# Patient Record
Sex: Male | Born: 1965 | Hispanic: Yes | Marital: Single | Smoking: Never smoker
Health system: Southern US, Community
[De-identification: ages and names within clinical notes are randomized; demographics above are authoritative.]

## PROBLEM LIST (undated history)

## (undated) DIAGNOSIS — I10 Essential (primary) hypertension: Secondary | ICD-10-CM

## (undated) DIAGNOSIS — K219 Gastro-esophageal reflux disease without esophagitis: Secondary | ICD-10-CM

## (undated) DIAGNOSIS — D649 Anemia, unspecified: Secondary | ICD-10-CM

## (undated) DIAGNOSIS — R51 Headache: Secondary | ICD-10-CM

---

## 2008-11-12 ENCOUNTER — Emergency Department (HOSPITAL_COMMUNITY): Admission: EM | Admit: 2008-11-12 | Discharge: 2008-11-12 | Payer: Self-pay | Admitting: Emergency Medicine

## 2010-09-09 ENCOUNTER — Emergency Department (HOSPITAL_COMMUNITY)
Admission: EM | Admit: 2010-09-09 | Discharge: 2010-09-09 | Payer: Self-pay | Source: Home / Self Care | Admitting: Emergency Medicine

## 2010-11-19 LAB — URINE MICROSCOPIC-ADD ON

## 2010-11-19 LAB — URINALYSIS, ROUTINE W REFLEX MICROSCOPIC
Glucose, UA: NEGATIVE mg/dL
Ketones, ur: 40 mg/dL — AB
Protein, ur: 100 mg/dL — AB

## 2010-11-19 LAB — COMPREHENSIVE METABOLIC PANEL
ALT: 100 U/L — ABNORMAL HIGH (ref 0–53)
Alkaline Phosphatase: 107 U/L (ref 39–117)
CO2: 27 mEq/L (ref 19–32)
Chloride: 74 mEq/L — ABNORMAL LOW (ref 96–112)
GFR calc non Af Amer: >60 mL/min (ref >60)
Glucose, Bld: 126 mg/dL — ABNORMAL HIGH (ref 70–99)
Potassium: 3.1 mEq/L — ABNORMAL LOW (ref 3.5–5.1)
Sodium: 118 mEq/L — CL (ref 135–145)
Total Protein: 6.7 g/dL (ref 6.0–8.3)

## 2010-11-19 LAB — CBC
HCT: 35.3 % — ABNORMAL LOW (ref 39.0–52.0)
Hemoglobin: 12.6 g/dL — ABNORMAL LOW (ref 13.0–17.0)
RBC: 4.19 MIL/uL — ABNORMAL LOW (ref 4.22–5.81)
WBC: 7.4 10*3/uL (ref 4.0–10.5)

## 2010-11-19 LAB — DIFFERENTIAL
Basophils Relative: 0 % (ref 0–1)
Eosinophils Absolute: 0 10*3/uL (ref 0.0–0.7)
Monocytes Relative: 10 % (ref 3–12)
Neutrophils Relative %: 87 % — ABNORMAL HIGH (ref 43–77)

## 2010-12-20 LAB — DIFFERENTIAL
Eosinophils Relative: 1 % (ref 0–5)
Lymphocytes Relative: 27 % (ref 12–46)
Lymphs Abs: 1.7 10*3/uL (ref 0.7–4.0)
Monocytes Absolute: 0.5 10*3/uL (ref 0.1–1.0)

## 2010-12-20 LAB — URINE CULTURE: Culture: NO GROWTH

## 2010-12-20 LAB — URINALYSIS, ROUTINE W REFLEX MICROSCOPIC
Glucose, UA: NEGATIVE mg/dL
Ketones, ur: NEGATIVE mg/dL
Protein, ur: NEGATIVE mg/dL

## 2010-12-20 LAB — BASIC METABOLIC PANEL
GFR calc Af Amer: >60 mL/min (ref >60)
GFR calc non Af Amer: >60 mL/min (ref >60)
Potassium: 3.6 mEq/L (ref 3.5–5.1)
Sodium: 137 mEq/L (ref 135–145)

## 2010-12-20 LAB — TYPE AND SCREEN
ABO/RH(D): A POS
Antibody Screen: NEGATIVE

## 2010-12-20 LAB — CBC
HCT: 40.2 % (ref 39.0–52.0)
Hemoglobin: 14.1 g/dL (ref 13.0–17.0)
RBC: 4.55 MIL/uL (ref 4.22–5.81)
WBC: 6.2 10*3/uL (ref 4.0–10.5)

## 2010-12-20 LAB — APTT: aPTT: 29 seconds (ref 24–37)

## 2010-12-20 LAB — ABO/RH: ABO/RH(D): A POS

## 2013-01-15 ENCOUNTER — Ambulatory Visit (INDEPENDENT_AMBULATORY_CARE_PROVIDER_SITE_OTHER): Payer: Self-pay | Admitting: General Surgery

## 2013-01-15 ENCOUNTER — Encounter (INDEPENDENT_AMBULATORY_CARE_PROVIDER_SITE_OTHER): Payer: Self-pay | Admitting: General Surgery

## 2013-01-15 VITALS — BP 134/82 | HR 64 | Temp 97.3°F | Resp 14 | Ht 65.0 in | Wt 155.8 lb

## 2013-01-15 DIAGNOSIS — K4020 Bilateral inguinal hernia, without obstruction or gangrene, not specified as recurrent: Secondary | ICD-10-CM

## 2013-01-15 NOTE — Progress Notes (Signed)
Subjective:     Patient ID: Randy Barker, male   DOB: 08/08/1966, 47 y.o.   MRN: [id number]  HPI Patient is a Spanish-speaking, 47-year-old male was self-referred for evaluation of a right inguinal hernia. The patient states she's had these for approximately 5 years it is becoming more bothersome as He works. The patient works in construction.  The patient states he is able to reduce the hernia however becomes more and symptomatic as his lifting heavy objects.  Review of Systems  Constitutional: Negative.   HENT: Negative.   Respiratory: Negative.   Cardiovascular: Negative.   Gastrointestinal: Negative.   Neurological: Negative.   All other systems reviewed and are negative.       Objective:   Physical Exam  Constitutional: He is oriented to person, place, and time. He appears well-developed and well-nourished.  HENT:  Head: Normocephalic and atraumatic.  Eyes: Conjunctivae and EOM are normal. Pupils are equal, round, and reactive to light.  Neck: Normal range of motion. Neck supple.  Cardiovascular: Normal rate, regular rhythm and normal heart sounds.   Pulmonary/Chest: Effort normal and breath sounds normal.  Abdominal: Bowel sounds are normal. A hernia is present. Hernia confirmed positive in the right inguinal area and confirmed positive in the left inguinal area.  Genitourinary: Penis normal.  Neurological: He is alert and oriented to person, place, and time.       Assessment:     47-year-old male with bilateral inguinal hernia.     Plan:     1. Patient like to proceed with operative repair. He would be determined for laparoscopic bilateral inguinal hernia repair with mesh. 2. All risks and benefits were discussed with the patient, to generally include infection, bleeding, damage to surrounding structures, acute and chronic nerve pain, and recurrence. Alternatives were offered and described.  All questions were answered and the patient voiced understanding of the  procedure and wishes to proceed at this point.        

## 2013-01-15 NOTE — Addendum Note (Signed)
Addended by: Axel Filler on: 01/15/2013 03:16 PM   Modules accepted: Orders

## 2013-01-21 ENCOUNTER — Encounter (HOSPITAL_COMMUNITY)
Admission: RE | Admit: 2013-01-21 | Discharge: 2013-01-21 | Disposition: A | Discharge status: Home / Self Care | Payer: Self-pay | Source: Ambulatory Visit | Attending: General Surgery | Admitting: General Surgery

## 2013-01-21 ENCOUNTER — Encounter (HOSPITAL_COMMUNITY): Payer: Self-pay

## 2013-01-21 HISTORY — DX: Gastro-esophageal reflux disease without esophagitis: K21.9

## 2013-01-21 HISTORY — DX: Headache: R51

## 2013-01-21 LAB — CBC
MCHC: 33.9 g/dL (ref 30.0–36.0)
MCV: 87.6 fL (ref 78.0–100.0)
Platelets: 218 10*3/uL (ref 150–400)
RDW: 13.4 % (ref 11.5–15.5)
WBC: 7.7 10*3/uL (ref 4.0–10.5)

## 2013-01-21 LAB — SURGICAL PCR SCREEN: Staphylococcus aureus: POSITIVE — AB

## 2013-01-21 NOTE — Progress Notes (Signed)
Interpreter arranged for surgery per Jamesetta Orleans

## 2013-01-21 NOTE — Pre-Procedure Instructions (Addendum)
Randy Barker  01/21/2013   Your procedure is scheduled on 01/25/13  Report to [facility] at 1200pm.  Call this number if you have problems the morning of surgery: [phone]   Remember:   Do not eat food or drink liquids after midnight.   Take these medicines the morning of surgery with A SIP OF WATER: none   Do not wear jewelry, make-up or nail polish.  Do not wear lotions, powders, or perfumes. You may wear deodorant.  Do not shave 48 hours prior to surgery. Men may shave face and neck.  Do not bring valuables to the hospital.  Contacts, dentures or bridgework may not be worn into surgery.  Leave suitcase in the car. After surgery it may be brought to your room.  For patients admitted to the hospital, checkout time is 11:00 AM the day of  discharge.   Patients discharged the day of surgery will not be allowed to drive  home.  Name and phone number of your driver:   Special Instructions: Shower using CHG 2 nights before surgery and the night before surgery.  If you shower the day of surgery use CHG.  Use special wash - you have one bottle of CHG for all showers.  You should use approximately 1/3 of the bottle for each shower.   Please read over the following fact sheets that you were given: Pain Booklet, Coughing and Deep Breathing, MRSA Information and Surgical Site Infection Prevention

## 2013-01-24 MED ORDER — CEFAZOLIN SODIUM-DEXTROSE 2-3 GM-% IV SOLR
2.0000 g | INTRAVENOUS | Status: AC
  Administered 2013-01-25: 2 g via INTRAVENOUS
  Filled 2013-01-24: qty 50

## 2013-01-25 ENCOUNTER — Ambulatory Visit (HOSPITAL_COMMUNITY)
Admission: RE | Admit: 2013-01-25 | Discharge: 2013-01-25 | Disposition: A | Discharge status: Home / Self Care | Payer: Self-pay | Source: Ambulatory Visit | Attending: General Surgery | Admitting: General Surgery

## 2013-01-25 ENCOUNTER — Encounter (HOSPITAL_COMMUNITY): Payer: Self-pay | Admitting: Anesthesiology

## 2013-01-25 ENCOUNTER — Ambulatory Visit (HOSPITAL_COMMUNITY): Payer: Self-pay | Admitting: Anesthesiology

## 2013-01-25 ENCOUNTER — Encounter (HOSPITAL_COMMUNITY): Payer: Self-pay | Admitting: *Deleted

## 2013-01-25 ENCOUNTER — Encounter (HOSPITAL_COMMUNITY)
Admission: RE | Disposition: A | Discharge status: Home / Self Care | Payer: Self-pay | Source: Ambulatory Visit | Attending: General Surgery

## 2013-01-25 DIAGNOSIS — K4020 Bilateral inguinal hernia, without obstruction or gangrene, not specified as recurrent: Secondary | ICD-10-CM

## 2013-01-25 DIAGNOSIS — Z01812 Encounter for preprocedural laboratory examination: Secondary | ICD-10-CM | POA: Insufficient documentation

## 2013-01-25 HISTORY — PX: INSERTION OF MESH: SHX5868

## 2013-01-25 HISTORY — PX: INGUINAL HERNIA REPAIR: SHX194

## 2013-01-25 SURGERY — REPAIR, HERNIA, INGUINAL, BILATERAL, LAPAROSCOPIC
Anesthesia: General | Site: Groin | Laterality: Bilateral | Wound class: Clean

## 2013-01-25 MED ORDER — PROPOFOL 10 MG/ML IV BOLUS
INTRAVENOUS | Status: DC | PRN
  Administered 2013-01-25: 150 mg via INTRAVENOUS

## 2013-01-25 MED ORDER — EPHEDRINE SULFATE 50 MG/ML IJ SOLN
INTRAMUSCULAR | Status: DC | PRN
  Administered 2013-01-25 (×2): 10 mg via INTRAVENOUS

## 2013-01-25 MED ORDER — LIDOCAINE HCL (CARDIAC) 20 MG/ML IV SOLN
INTRAVENOUS | Status: DC | PRN
  Administered 2013-01-25: 100 mg via INTRAVENOUS

## 2013-01-25 MED ORDER — OXYCODONE-ACETAMINOPHEN 5-325 MG PO TABS: 1.0000 | ORAL_TABLET | ORAL | Status: DC | PRN

## 2013-01-25 MED ORDER — HYDROMORPHONE HCL PF 1 MG/ML IJ SOLN
INTRAMUSCULAR | Status: AC
  Filled 2013-01-25: qty 1

## 2013-01-25 MED ORDER — LACTATED RINGERS IV SOLN
INTRAVENOUS | Status: DC
  Administered 2013-01-25: 15:00:00 via INTRAVENOUS
  Administered 2013-01-25: 50 mL/h via INTRAVENOUS

## 2013-01-25 MED ORDER — FENTANYL CITRATE 0.05 MG/ML IJ SOLN
INTRAMUSCULAR | Status: DC | PRN
  Administered 2013-01-25: 100 ug via INTRAVENOUS
  Administered 2013-01-25 (×3): 50 ug via INTRAVENOUS

## 2013-01-25 MED ORDER — NEOSTIGMINE METHYLSULFATE 1 MG/ML IJ SOLN
INTRAMUSCULAR | Status: DC | PRN
  Administered 2013-01-25: 4 mg via INTRAVENOUS

## 2013-01-25 MED ORDER — ACETAMINOPHEN 10 MG/ML IV SOLN
INTRAVENOUS | Status: DC | PRN
  Administered 2013-01-25: 1000 mg via INTRAVENOUS

## 2013-01-25 MED ORDER — ROCURONIUM BROMIDE 100 MG/10ML IV SOLN
INTRAVENOUS | Status: DC | PRN
  Administered 2013-01-25: 10 mg via INTRAVENOUS
  Administered 2013-01-25: 50 mg via INTRAVENOUS

## 2013-01-25 MED ORDER — GLYCOPYRROLATE 0.2 MG/ML IJ SOLN
INTRAMUSCULAR | Status: DC | PRN
  Administered 2013-01-25: 0.6 mg via INTRAVENOUS

## 2013-01-25 MED ORDER — MEPERIDINE HCL 25 MG/ML IJ SOLN: 6.2500 mg | INTRAMUSCULAR | Status: DC | PRN

## 2013-01-25 MED ORDER — CHLORHEXIDINE GLUCONATE 4 % EX LIQD: 1.0000 "application " | Freq: Once | CUTANEOUS | Status: DC

## 2013-01-25 MED ORDER — OXYCODONE HCL 5 MG/5ML PO SOLN: 5.0000 mg | Freq: Once | ORAL | Status: DC | PRN

## 2013-01-25 MED ORDER — PROMETHAZINE HCL 25 MG/ML IJ SOLN
6.2500 mg | INTRAMUSCULAR | Status: DC | PRN
  Administered 2013-01-25: 12.5 mg via INTRAVENOUS
  Filled 2013-01-25: qty 1

## 2013-01-25 MED ORDER — ONDANSETRON HCL 4 MG/2ML IJ SOLN
INTRAMUSCULAR | Status: DC | PRN
  Administered 2013-01-25: 4 mg via INTRAVENOUS

## 2013-01-25 MED ORDER — 0.9 % SODIUM CHLORIDE (POUR BTL) OPTIME
TOPICAL | Status: DC | PRN
  Administered 2013-01-25: 1000 mL

## 2013-01-25 MED ORDER — HYDROMORPHONE HCL PF 1 MG/ML IJ SOLN
0.2500 mg | INTRAMUSCULAR | Status: DC | PRN
  Administered 2013-01-25 (×2): 0.5 mg via INTRAVENOUS

## 2013-01-25 MED ORDER — OXYCODONE HCL 5 MG PO TABS: 5.0000 mg | ORAL_TABLET | Freq: Once | ORAL | Status: DC | PRN

## 2013-01-25 MED ORDER — CEFAZOLIN SODIUM-DEXTROSE 2-3 GM-% IV SOLR
INTRAVENOUS | Status: AC
  Filled 2013-01-25: qty 50

## 2013-01-25 MED ORDER — BUPIVACAINE HCL (PF) 0.25 % IJ SOLN
INTRAMUSCULAR | Status: AC
  Filled 2013-01-25: qty 30

## 2013-01-25 MED ORDER — BUPIVACAINE HCL (PF) 0.25 % IJ SOLN
INTRAMUSCULAR | Status: DC | PRN
  Administered 2013-01-25: 5 mL

## 2013-01-25 MED ORDER — MIDAZOLAM HCL 5 MG/5ML IJ SOLN
INTRAMUSCULAR | Status: DC | PRN
  Administered 2013-01-25: 2 mg via INTRAVENOUS

## 2013-01-25 SURGICAL SUPPLY — 52 items
APPLIER CLIP LOGIC TI 5 (MISCELLANEOUS) IMPLANT
BENZOIN TINCTURE PRP APPL 2/3 (GAUZE/BANDAGES/DRESSINGS) ×2 IMPLANT
CANISTER SUCTION 2500CC (MISCELLANEOUS) IMPLANT
CHLORAPREP W/TINT 26ML (MISCELLANEOUS) ×4 IMPLANT
CLOTH BEACON ORANGE TIMEOUT ST (SAFETY) ×2 IMPLANT
COVER SURGICAL LIGHT HANDLE (MISCELLANEOUS) ×2 IMPLANT
DEVICE TROCAR PUNCTURE CLOSURE (ENDOMECHANICALS) ×2 IMPLANT
DISSECT BALLN SPACEMKR + OVL (BALLOONS) ×2
DISSECTOR BALLN SPACEMKR + OVL (BALLOONS) ×1 IMPLANT
DISSECTOR BLUNT TIP ENDO 5MM (MISCELLANEOUS) IMPLANT
DRAPE UTILITY 15X26 W/TAPE STR (DRAPE) ×4 IMPLANT
ELECT REM PT RETURN 9FT ADLT (ELECTROSURGICAL) ×2
ELECTRODE REM PT RTRN 9FT ADLT (ELECTROSURGICAL) ×1 IMPLANT
GAUZE SPONGE 2X2 8PLY STRL LF (GAUZE/BANDAGES/DRESSINGS) ×1 IMPLANT
GLOVE BIO SURGEON STRL SZ7.5 (GLOVE) ×2 IMPLANT
GLOVE BIOGEL PI IND STRL 6 (GLOVE) ×1 IMPLANT
GLOVE BIOGEL PI IND STRL 6.5 (GLOVE) IMPLANT
GLOVE BIOGEL PI IND STRL 7.0 (GLOVE) ×1 IMPLANT
GLOVE BIOGEL PI IND STRL 7.5 (GLOVE) ×1 IMPLANT
GLOVE BIOGEL PI INDICATOR 6 (GLOVE) ×1
GLOVE BIOGEL PI INDICATOR 6.5 (GLOVE)
GLOVE BIOGEL PI INDICATOR 7.0 (GLOVE) ×1
GLOVE BIOGEL PI INDICATOR 7.5 (GLOVE) ×1
GLOVE ECLIPSE 7.5 STRL STRAW (GLOVE) ×2 IMPLANT
GLOVE ECLIPSE 8.0 STRL XLNG CF (GLOVE) ×2 IMPLANT
GLOVE SURG SS PI 7.0 STRL IVOR (GLOVE) ×2 IMPLANT
GOWN STRL NON-REIN LRG LVL3 (GOWN DISPOSABLE) ×4 IMPLANT
GOWN STRL REIN XL XLG (GOWN DISPOSABLE) ×2 IMPLANT
KIT BASIN OR (CUSTOM PROCEDURE TRAY) ×2 IMPLANT
KIT ROOM TURNOVER OR (KITS) ×2 IMPLANT
MESH PARIETEX 20X20CM (Mesh General) ×2 IMPLANT
NEEDLE INSUFFLATION 14GA 120MM (NEEDLE) ×2 IMPLANT
NS IRRIG 1000ML POUR BTL (IV SOLUTION) ×2 IMPLANT
PAD ARMBOARD 7.5X6 YLW CONV (MISCELLANEOUS) ×4 IMPLANT
RELOAD STAPLE HERNIA 4.0 BLUE (INSTRUMENTS) ×6 IMPLANT
RELOAD STAPLE HERNIA 4.8 BLK (STAPLE) IMPLANT
SCISSORS LAP 5X35 DISP (ENDOMECHANICALS) ×2 IMPLANT
SET IRRIG TUBING LAPAROSCOPIC (IRRIGATION / IRRIGATOR) IMPLANT
SET TROCAR LAP APPLE-HUNT 5MM (ENDOMECHANICALS) ×2 IMPLANT
SLEEVE ENDOPATH XCEL 5M (ENDOMECHANICALS) ×2 IMPLANT
SPONGE GAUZE 2X2 STER 10/PKG (GAUZE/BANDAGES/DRESSINGS) ×1
STAPLER HERNIA 12 8.5 360D (INSTRUMENTS) ×2 IMPLANT
SUT MNCRL AB 4-0 PS2 18 (SUTURE) ×4 IMPLANT
SUT VIC AB 1 CT1 27 (SUTURE) ×1
SUT VIC AB 1 CT1 27XBRD ANBCTR (SUTURE) ×1 IMPLANT
TOWEL OR 17X24 6PK STRL BLUE (TOWEL DISPOSABLE) ×2 IMPLANT
TOWEL OR 17X26 10 PK STRL BLUE (TOWEL DISPOSABLE) ×2 IMPLANT
TRAY FOLEY CATH 14FR (SET/KITS/TRAYS/PACK) ×2 IMPLANT
TRAY LAPAROSCOPIC (CUSTOM PROCEDURE TRAY) ×2 IMPLANT
TROCAR XCEL 12X100 BLDLESS (ENDOMECHANICALS) IMPLANT
TROCAR XCEL NON-BLD 11X100MML (ENDOMECHANICALS) IMPLANT
TROCAR XCEL NON-BLD 5MMX100MML (ENDOMECHANICALS) ×2 IMPLANT

## 2013-01-25 NOTE — Anesthesia Procedure Notes (Signed)
Procedure Name: Intubation Date/Time: 01/25/2013 1:25 PM Performed by: Sherie Don Pre-anesthesia Checklist: Patient identified, Emergency Drugs available, Suction available, Patient being monitored and Timeout performed Patient Re-evaluated:Patient Re-evaluated prior to inductionOxygen Delivery Method: Circle system utilized Preoxygenation: Pre-oxygenation with 100% oxygen Intubation Type: IV induction Ventilation: Mask ventilation without difficulty Laryngoscope Size: Mac and 4 Grade View: Grade II Tube type: Oral Tube size: 7.5 mm Airway Equipment and Method: Stylet Placement Confirmation: ETT inserted through vocal cords under direct vision and breath sounds checked- equal and bilateral Secured at: 23 cm Dental Injury: Teeth and Oropharynx as per pre-operative assessment

## 2013-01-25 NOTE — Transfer of Care (Signed)
Immediate Anesthesia Transfer of Care Note  Patient: Randy Barker  Procedure(s) Performed: Procedure(s): LAPAROSCOPIC BILATERAL INGUINAL HERNIA REPAIR (Bilateral) INSERTION OF MESH (Bilateral)  Patient Location: PACU  Anesthesia Type:General  Level of Consciousness: awake and alert   Airway & Oxygen Therapy: Patient Spontanous Breathing  Post-op Assessment: Report given to PACU RN and Post -op Vital signs reviewed and stable  Post vital signs: Reviewed and stable  Complications: No apparent anesthesia complications

## 2013-01-25 NOTE — Preoperative (Signed)
Beta Blockers   Reason not to administer Beta Blockers:Not Applicable 

## 2013-01-25 NOTE — Interval H&P Note (Signed)
History and Physical Interval Note:  01/25/2013 12:58 PM  Randy Barker  has presented today for surgery, with the diagnosis of HERNIA  The various methods of treatment have been discussed with the patient and family. After consideration of risks, benefits and other options for treatment, the patient has consented to  Procedure(s): LAPAROSCOPIC BILATERAL INGUINAL HERNIA REPAIR (Bilateral) INSERTION OF MESH (Bilateral) as a surgical intervention .  The patient's history has been reviewed, patient examined, no change in status, stable for surgery.  I have reviewed the patient's chart and labs.  Questions were answered to the patient's satisfaction.     Marigene Ehlers., Jed Limerick

## 2013-01-25 NOTE — Op Note (Signed)
Pre Operative Diagnosis: bilateral IH  Post Operative Diagnosis: laparoscopic bilateral inguinal pantaloon hernia  Procedure: laparoscopic Bilateral inguinal hernia repair with mesh  Surgeon: Dr. Axel Filler  Assistant: none  Anesthesia: GETA  EBL: 5cc  Complications: none  Counts: reported as correct x 2  Findings:  The patient had a small right indirect hernia  Indications for procedure:  The patient is a 47 -year-old male with a bilateral hernia for several months. Patient complained of symptomatology to his bilateral inguinal area. The patient was taken back for elective inguinal hernia repair.  Details of the procedure: The patient was taken back to the operating room. The patient was placed in supine position with bilateral SCDs in place. After appropriate anitbiotics were confirmed, a time-out was confirmed and all facts were verified. 0.25% Marcaine was used to infiltrate the umbilical area. He was used to cut down the skin and blunt dissection was used to get the anterior fashion.  The anterior fascia was incised approximately 1 cm and the muscles were divided anteriorly. Blunt dissection was then used to create a space in the preperitoneal area. At this time a 10 mm camera was then introduced into the space and advanced the pubic tubercle and a 12 mm trocar was placed over this and insufflation was started.  At this time a space was created from medial to laterally in the preperitoneal space. A direct hernia defect was seen and the hernia was reduced and the transversalis fascia was peeled away of the surrounding tissue.  A hernia sac was identified in the indirect space. Dissection of the hernia sac was undertaken the vas deferens was identified and protected in all parts of the case.  There was a small tear into the hernia sac. A Veress needle right upper quadrant to help evacuate the intraperitoneal air.  Once the hernia sac was taken down to approximately the umbilicus a TET  20x20 Parietex mesh was cut into shape and introduced into the preperitoneal space.  The mesh was brought over the hernia space defect and anchored into place and secured to Cooper's ligament with 4.53mm staples from a Cov[place] hernia stapler. It was anchored to the anterior abdominal wall with 4.8 mm staples. The hernia sac was seen lying anterior to the mesh. There was no staples placed laterally. The insufflation was evacuated. The trochars were removed. The anterior fascia was reapproximated using #1 Vicryl on a UR- 6.  Intra-abdominal air was evacuated and the Veress needle removed. The skin was reapproximated using 4-0 Monocryl subcuticular fashion the patient was awakened from general anesthesia and taken to recovery in stable condition.

## 2013-01-25 NOTE — Anesthesia Postprocedure Evaluation (Signed)
  Anesthesia Post-op Note  Patient: Randy Barker  Procedure(s) Performed: Procedure(s): LAPAROSCOPIC BILATERAL INGUINAL HERNIA REPAIR (Bilateral) INSERTION OF MESH (Bilateral)  Patient Location: PACU  Anesthesia Type:General  Level of Consciousness: awake  Airway and Oxygen Therapy: Patient Spontanous Breathing  Post-op Pain: mild  Post-op Assessment: Post-op Vital signs reviewed  Post-op Vital Signs: Reviewed  Complications: No apparent anesthesia complications

## 2013-01-25 NOTE — Anesthesia Preprocedure Evaluation (Signed)
Anesthesia Evaluation  Patient identified by MRN, date of birth, ID band Patient awake    Airway Mallampati: I  Neck ROM: Full    Dental   Pulmonary  breath sounds clear to auscultation        Cardiovascular negative cardio ROS  Rhythm:Regular Rate:Normal     Neuro/Psych  Headaches,    GI/Hepatic Neg liver ROS, GERD-  ,  Endo/Other  negative endocrine ROS  Renal/GU      Musculoskeletal negative musculoskeletal ROS (+)   Abdominal   Peds  Hematology negative hematology ROS (+)   Anesthesia Other Findings   Reproductive/Obstetrics negative OB ROS                           Anesthesia Physical Anesthesia Plan  ASA: I  Anesthesia Plan: General   Post-op Pain Management:    Induction: Intravenous  Airway Management Planned: Oral ETT  Additional Equipment:   Intra-op Plan:   Post-operative Plan: Extubation in OR  Informed Consent: I have reviewed the patients History and Physical, chart, labs and discussed the procedure including the risks, benefits and alternatives for the proposed anesthesia with the patient or authorized representative who has indicated his/her understanding and acceptance.   Dental advisory given  Plan Discussed with: CRNA and Surgeon  Anesthesia Plan Comments:         Anesthesia Quick Evaluation

## 2013-01-25 NOTE — H&P (View-Only) (Signed)
Subjective:     Patient ID: Randy Barker, male   DOB: 1966/06/06, 47 y.o.   MRN: [id number]  HPI Patient is a Spanish-speaking, 47 year old male was self-referred for evaluation of a right inguinal hernia. The patient states she's had these for approximately 5 years it is becoming more bothersome as He works. The patient works in [employer].  The patient states he is able to reduce the hernia however becomes more and symptomatic as his lifting heavy objects.  Review of Systems  Constitutional: Negative.   HENT: Negative.   Respiratory: Negative.   Cardiovascular: Negative.   Gastrointestinal: Negative.   Neurological: Negative.   All other systems reviewed and are negative.       Objective:   Physical Exam  Constitutional: He is oriented to person, place, and time. He appears well-developed and well-nourished.  HENT:  Head: Normocephalic and atraumatic.  Eyes: Conjunctivae and EOM are normal. Pupils are equal, round, and reactive to light.  Neck: Normal range of motion. Neck supple.  Cardiovascular: Normal rate, regular rhythm and normal heart sounds.   Pulmonary/Chest: Effort normal and breath sounds normal.  Abdominal: Bowel sounds are normal. A hernia is present. Hernia confirmed positive in the right inguinal area and confirmed positive in the left inguinal area.  Genitourinary: Penis normal.  Neurological: He is alert and oriented to person, place, and time.       Assessment:     47 year old male with bilateral inguinal hernia.     Plan:     1. Patient like to proceed with operative repair. He would be determined for laparoscopic bilateral inguinal hernia repair with mesh. 2. All risks and benefits were discussed with the patient, to generally include infection, bleeding, damage to surrounding structures, acute and chronic nerve pain, and recurrence. Alternatives were offered and described.  All questions were answered and the patient voiced understanding of the  procedure and wishes to proceed at this point.

## 2013-01-26 ENCOUNTER — Encounter (HOSPITAL_COMMUNITY): Payer: Self-pay | Admitting: General Surgery

## 2013-02-11 ENCOUNTER — Ambulatory Visit (INDEPENDENT_AMBULATORY_CARE_PROVIDER_SITE_OTHER): Payer: Self-pay | Admitting: General Surgery

## 2013-02-11 ENCOUNTER — Encounter (INDEPENDENT_AMBULATORY_CARE_PROVIDER_SITE_OTHER): Payer: Self-pay | Admitting: General Surgery

## 2013-02-11 VITALS — BP 128/78 | HR 62 | Temp 98.2°F | Resp 17 | Ht 67.0 in | Wt 156.4 lb

## 2013-02-11 DIAGNOSIS — Z9889 Other specified postprocedural states: Secondary | ICD-10-CM

## 2013-02-11 DIAGNOSIS — Z8719 Personal history of other diseases of the digestive system: Secondary | ICD-10-CM

## 2013-02-11 NOTE — Progress Notes (Signed)
Patient ID: Randy Barker, male   DOB: 03-11-1966, 47 y.o.   MRN: [id number] The patient is a 47 year old male status post laparoscopic bilateral inguinal hernia repair with Mesh. The patient has been doing well postoperatively and has had no pain in his bilateral inguinal. Areas.  The patient works as a Psychiatrist. We discussed name lifting for one month. The patient can return to work however without any heavy lifting greater than 10-15 pounds. This was discussed with the patient.  On exam:  There is no hernia palpable by laterally. The wounds are clean dry and intact.  A 47 year old male status post laparoscopic bilateral inguinal hernia repair with mesh. 1. Patient to follow up when necessary 2. We discussed one-month restriction of no heavy lifting. Subsequent to this patient and he can work with no strictures.

## 2017-11-18 ENCOUNTER — Inpatient Hospital Stay (HOSPITAL_COMMUNITY): Payer: Medicaid Other

## 2017-11-18 ENCOUNTER — Emergency Department (HOSPITAL_COMMUNITY): Payer: Medicaid Other

## 2017-11-18 ENCOUNTER — Inpatient Hospital Stay (HOSPITAL_COMMUNITY)
Admission: EM | Admit: 2017-11-18 | Discharge: 2017-11-21 | DRG: 511 | Disposition: A | Discharge status: Home / Self Care | Payer: Medicaid Other | Attending: Student | Admitting: Student

## 2017-11-18 ENCOUNTER — Encounter (HOSPITAL_COMMUNITY): Payer: Self-pay

## 2017-11-18 DIAGNOSIS — S52121A Displaced fracture of head of right radius, initial encounter for closed fracture: Secondary | ICD-10-CM | POA: Diagnosis present

## 2017-11-18 DIAGNOSIS — S52122A Displaced fracture of head of left radius, initial encounter for closed fracture: Secondary | ICD-10-CM | POA: Diagnosis present

## 2017-11-18 DIAGNOSIS — Y990 Civilian activity done for income or pay: Secondary | ICD-10-CM | POA: Diagnosis not present

## 2017-11-18 DIAGNOSIS — R52 Pain, unspecified: Secondary | ICD-10-CM | POA: Diagnosis present

## 2017-11-18 DIAGNOSIS — S42409A Unspecified fracture of lower end of unspecified humerus, initial encounter for closed fracture: Secondary | ICD-10-CM

## 2017-11-18 DIAGNOSIS — Z87891 Personal history of nicotine dependence: Secondary | ICD-10-CM | POA: Diagnosis not present

## 2017-11-18 DIAGNOSIS — S52041A Displaced fracture of coronoid process of right ulna, initial encounter for closed fracture: Secondary | ICD-10-CM

## 2017-11-18 DIAGNOSIS — S42401A Unspecified fracture of lower end of right humerus, initial encounter for closed fracture: Secondary | ICD-10-CM | POA: Diagnosis present

## 2017-11-18 DIAGNOSIS — S0081XA Abrasion of other part of head, initial encounter: Secondary | ICD-10-CM | POA: Diagnosis present

## 2017-11-18 DIAGNOSIS — R51 Headache: Secondary | ICD-10-CM | POA: Diagnosis present

## 2017-11-18 DIAGNOSIS — S52271A Monteggia's fracture of right ulna, initial encounter for closed fracture: Principal | ICD-10-CM

## 2017-11-18 DIAGNOSIS — Z419 Encounter for procedure for purposes other than remedying health state, unspecified: Secondary | ICD-10-CM

## 2017-11-18 DIAGNOSIS — S62102A Fracture of unspecified carpal bone, left wrist, initial encounter for closed fracture: Secondary | ICD-10-CM

## 2017-11-18 DIAGNOSIS — N219 Calculus of lower urinary tract, unspecified: Secondary | ICD-10-CM | POA: Diagnosis present

## 2017-11-18 DIAGNOSIS — T148XXA Other injury of unspecified body region, initial encounter: Secondary | ICD-10-CM

## 2017-11-18 DIAGNOSIS — W1789XA Other fall from one level to another, initial encounter: Secondary | ICD-10-CM | POA: Diagnosis not present

## 2017-11-18 DIAGNOSIS — S52021A Displaced fracture of olecranon process without intraarticular extension of right ulna, initial encounter for closed fracture: Secondary | ICD-10-CM | POA: Diagnosis present

## 2017-11-18 DIAGNOSIS — K219 Gastro-esophageal reflux disease without esophagitis: Secondary | ICD-10-CM | POA: Diagnosis present

## 2017-11-18 DIAGNOSIS — S53104A Unspecified dislocation of right ulnohumeral joint, initial encounter: Secondary | ICD-10-CM

## 2017-11-18 DIAGNOSIS — S62101A Fracture of unspecified carpal bone, right wrist, initial encounter for closed fracture: Secondary | ICD-10-CM

## 2017-11-18 MED ORDER — HYDROMORPHONE HCL 1 MG/ML IJ SOLN
1.0000 mg | Freq: Once | INTRAMUSCULAR | Status: AC
  Administered 2017-11-18: 1 mg via INTRAVENOUS
  Filled 2017-11-18: qty 1

## 2017-11-18 MED ORDER — HYDROMORPHONE HCL 1 MG/ML IJ SOLN
1.0000 mg | INTRAMUSCULAR | Status: DC | PRN
Start: 2017-11-18 — End: 2017-11-18

## 2017-11-18 MED ORDER — ONDANSETRON HCL 4 MG/2ML IJ SOLN
4.0000 mg | Freq: Four times a day (QID) | INTRAMUSCULAR | Status: DC | PRN
  Administered 2017-11-18: 4 mg via INTRAVENOUS
  Filled 2017-11-18: qty 2

## 2017-11-18 MED ORDER — CHLORHEXIDINE GLUCONATE 4 % EX LIQD
60.0000 mL | Freq: Once | CUTANEOUS | Status: DC
  Filled 2017-11-18: qty 60

## 2017-11-18 MED ORDER — ACETAMINOPHEN 325 MG PO TABS
650.0000 mg | ORAL_TABLET | Freq: Four times a day (QID) | ORAL | Status: DC | PRN
  Administered 2017-11-19 – 2017-11-21 (×5): 650 mg via ORAL
  Filled 2017-11-18 (×5): qty 2

## 2017-11-18 MED ORDER — ONDANSETRON HCL 4 MG PO TABS: 4.0000 mg | ORAL_TABLET | Freq: Four times a day (QID) | ORAL | Status: DC | PRN

## 2017-11-18 MED ORDER — TETANUS-DIPHTH-ACELL PERTUSSIS 5-2.5-18.5 LF-MCG/0.5 IM SUSP
0.5000 mL | Freq: Once | INTRAMUSCULAR | Status: AC
  Administered 2017-11-18: 0.5 mL via INTRAMUSCULAR
  Filled 2017-11-18: qty 0.5

## 2017-11-18 MED ORDER — MORPHINE SULFATE (PF) 10 MG/ML IV SOLN
10.0000 mg | INTRAVENOUS | Status: DC | PRN
  Administered 2017-11-18: 10 mg via INTRAVENOUS
  Filled 2017-11-18: qty 1

## 2017-11-18 MED ORDER — ENOXAPARIN SODIUM 40 MG/0.4ML ~~LOC~~ SOLN
40.0000 mg | SUBCUTANEOUS | Status: DC
  Administered 2017-11-18 – 2017-11-20 (×3): 40 mg via SUBCUTANEOUS
  Filled 2017-11-18 (×3): qty 0.4

## 2017-11-18 MED ORDER — CEFAZOLIN SODIUM-DEXTROSE 2-4 GM/100ML-% IV SOLN
2.0000 g | INTRAVENOUS | Status: AC
  Administered 2017-11-19 (×2): 2 g via INTRAVENOUS
  Filled 2017-11-18 (×2): qty 100

## 2017-11-18 MED ORDER — MORPHINE SULFATE (PF) 4 MG/ML IV SOLN
2.0000 mg | INTRAVENOUS | Status: DC | PRN
  Administered 2017-11-18 – 2017-11-20 (×4): 2 mg via INTRAVENOUS
  Filled 2017-11-18 (×5): qty 1

## 2017-11-18 MED ORDER — ACETAMINOPHEN 650 MG RE SUPP: 650.0000 mg | Freq: Four times a day (QID) | RECTAL | Status: DC | PRN

## 2017-11-18 MED ORDER — PROPOFOL 10 MG/ML IV BOLUS
INTRAVENOUS | Status: AC | PRN
Start: 2017-11-18 — End: 2017-11-18
  Administered 2017-11-18 (×2): 70 mg via INTRAVENOUS
  Administered 2017-11-18: 30 mg via INTRAVENOUS

## 2017-11-18 MED ORDER — PROPOFOL 10 MG/ML IV BOLUS
70.0000 mg | Freq: Once | INTRAVENOUS | Status: DC
  Filled 2017-11-18: qty 20

## 2017-11-18 MED ORDER — OXYCODONE HCL 5 MG PO TABS
5.0000 mg | ORAL_TABLET | ORAL | Status: DC | PRN
  Administered 2017-11-18 – 2017-11-19 (×2): 10 mg via ORAL
  Administered 2017-11-19 – 2017-11-21 (×3): 15 mg via ORAL
  Filled 2017-11-18 (×3): qty 3
  Filled 2017-11-18 (×3): qty 2

## 2017-11-18 MED ORDER — POVIDONE-IODINE 10 % EX SWAB: 2.0000 "application " | Freq: Once | CUTANEOUS | Status: DC

## 2017-11-18 NOTE — ED Notes (Signed)
Patient taken to CT.

## 2017-11-18 NOTE — ED Notes (Signed)
Pt attempted to eat dinner tray with assistance of family at bedside. Pt had one episode of vomiting. Nausea medicine to be given and pt reports he will attempt to eat again later. Aware he is NPO at midnight.

## 2017-11-18 NOTE — Procedures (Signed)
Procedure: Right elbow closed reduction  Indication: Right elbow dislocation/fracture  Surgeon: Charma IgoMichael Ralynn San, PA-C  Assist: None  Anesthesia: Conscious sedation with propofol via EDP  EBL: None  Complications: Infiltration of 10ml propofol in left [facility]  Findings: After risks/benefits explained patient desires to undergo procedure. Consent obtained and time out performed. 10ml propfol given with no effect, determined IV infiltrated despite flowing well. New IV established and adequate sedation achieved. Elbow relocated and both arms splinted. Pt tolerated the procedure well.    Freeman CaldronMichael J. Ednamae Schiano, PA-C Orthopedic Surgery [phone]

## 2017-11-18 NOTE — Sedation Documentation (Signed)
Patient is resting comfortably. 

## 2017-11-18 NOTE — ED Notes (Signed)
XRay AT BEDSIDE

## 2017-11-18 NOTE — ED Notes (Signed)
Ice placed on left extremity. Both elevated.   Pt given sips of orange juice and water. Meal tray to be ordered.

## 2017-11-18 NOTE — H&P (Signed)
Randy Barker is an 52 y.o. male.   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 He put his arms out to break his fall and had immediate pain in both. [facility] where x-rays showed bilateral elbow and wrist fxs with the right elbow dislocated. Orthopedic surgery was consulted and the patient was [facility] for treatment by the traumatic orthopedic specialist. He is Spanish-speaking only and ambidextrous.  Past Medical History:  Diagnosis Date  . GERD (gastroesophageal reflux disease)    occ  . [id number](440.1Headache(784.0)     Past Surgical History:  Procedure Laterality Date  . INGUINAL HERNIA REPAIR Bilateral 01/25/2013   Procedure: LAPAROSCOPIC BILATERAL INGUINAL HERNIA REPAIR;  Surgeon: Axel FillerArmando Ramirez, MD;  Location: [facility] OR;  Service: General;  Laterality: Bilateral;  . INSERTION OF MESH Bilateral 01/25/2013   Procedure: INSERTION OF MESH;  Surgeon: Axel FillerArmando Ramirez, MD;  Location: [facility] OR;  Service: General;  Laterality: Bilateral;    History reviewed. No pertinent family history. Social History:  reports that he quit smoking about 7 years ago. His smoking use included cigarettes. He has a 2.50 pack-year smoking history. he has never used smokeless tobacco. He reports that he does not drink alcohol or use drugs.  Allergies: No Known Allergies   (Not in a hospital admission)  No results found for this or any previous visit (from the past 48 hour(s)). 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 FINDINGS: There is fracture of the radial head with rotation and displacement of the radial head. There is a large left elbow joint effusion present displacing anterior and posterior fat pads. Alignment is normal. No additional fracture is seen. IMPRESSION: Fracture of the radial head with displacement and rotation of the  head. Large left elbow joint effusion. Electronically Signed   By: Dwyane DeePaul  Barry M.D.   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 FINDINGS: There is fracture dislocation of the right elbow. Fractures appear to involve the radial head, comminuted fracture of the proximal ulna with posterior dislocation and considerable soft tissue swelling. IMPRESSION: Fracture dislocation of the right elbow Electronically Signed   By: Dwyane DeePaul  Barry M.D.   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 Over the dorsum of the wrist, there is a bony fragment present presumably from a carpal bone. CT of the wrist may be helpful to assess fracture location. Otherwise alignment is normal. The distal radius and ulna are intact. IMPRESSION: Apparent fracture fragment over the dorsum of the wrist presumably from a carpal bone. Recommend CT of the wrist to assess further. Electronically Signed   By: Dwyane DeePaul  Barry M.D.   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 FINDINGS: The distal left radius and ulna are intact. However there is a small bony density  over the ulnar aspect of the wrist dorsally probably a small avulsion fragment from the triquetrum bone. There is soft tissue swelling over the dorsum of the wrist. Alignment is normal. IMPRESSION: Suspect small avulsion fracture fragment over the dorsum of the wrist possibly from the triquetrum bone. CT of the wrist may be helpful to assess this fracture further.  Electronically Signed   By: Dwyane Dee M.D.   On: 11/18/2017 12:06   Ct Head Wo Contrast  Result Date: 11/18/2017 CLINICAL DATA:  Fall at work on [employer] representative site. Abrasion to right cheek. EXAM: CT HEAD WITHOUT CONTRAST CT MAXILLOFACIAL WITHOUT CONTRAST CT CERVICAL SPINE WITHOUT CONTRAST TECHNIQUE: Multidetector CT imaging of the head, cervical spine, and maxillofacial structures were performed using the standard protocol without intravenous contrast. Multiplanar CT image reconstructions of the cervical spine and maxillofacial structures were also generated. COMPARISON:  None. FINDINGS: CT HEAD FINDINGS Brain: No evidence of acute infarction, hemorrhage, hydrocephalus, extra-axial collection or mass lesion/mass effect. Vascular: No hyperdense vessel or unexpected calcification. Skull: Normal. Negative for fracture or focal lesion. Other: None. CT MAXILLOFACIAL FINDINGS Osseous: No fracture or mandibular dislocation. No destructive process. Orbits: Negative. No traumatic or inflammatory finding. Sinuses: Clear. Soft tissues: Right malar soft tissue hematoma. CT CERVICAL SPINE FINDINGS Alignment: Straightening of the normal cervical lordosis. No traumatic malalignment. Skull base and vertebrae: No acute fracture. No primary bone lesion or focal pathologic process. Soft tissues and spinal canal: No prevertebral fluid or swelling. No visible canal hematoma. Disc levels: Mild right neuroforaminal stenosis at C3-C4 due to facet uncovertebral hypertrophy. Mild disc height loss with small posterior disc osteophyte complex and uncovertebral hypertrophy at C5-C6. Moderate disc height loss and facet uncovertebral hypertrophy at C6-C7. Moderate left neuroforaminal stenosis at C6-C7. Moderate right T1-T2 facet arthropathy. Upper chest: Negative. Other: None. IMPRESSION: 1.  No acute intracranial abnormality. 2. No acute facial fracture.  Right malar soft tissue hematoma. 3. No acute cervical spine fracture.  Mild-to-moderate degenerative disc disease at C5-C6 and C6-C7. Electronically Signed   By: Obie Dredge M.D.   On: 11/18/2017 11:23   Ct Cervical Spine Wo Contrast  Result Date: 11/18/2017 CLINICAL DATA:  Fall at work on [employer] representative site. Abrasion to right cheek. EXAM: CT HEAD WITHOUT CONTRAST CT MAXILLOFACIAL WITHOUT CONTRAST CT CERVICAL SPINE WITHOUT CONTRAST TECHNIQUE: Multidetector CT imaging of the head, cervical spine, and maxillofacial structures were performed using the standard protocol without intravenous contrast. Multiplanar CT image reconstructions of the cervical spine and maxillofacial structures were also generated. COMPARISON:  None. FINDINGS: CT HEAD FINDINGS Brain: No evidence of acute infarction, hemorrhage, hydrocephalus, extra-axial collection or mass lesion/mass effect. Vascular: No hyperdense vessel or unexpected calcification. Skull: Normal. Negative for fracture or focal lesion. Other: None. CT MAXILLOFACIAL FINDINGS Osseous: No fracture or mandibular dislocation. No destructive process. Orbits: Negative. No traumatic or inflammatory finding. Sinuses: Clear. Soft tissues: Right malar soft tissue hematoma. CT CERVICAL SPINE FINDINGS Alignment: Straightening of the normal cervical lordosis. No traumatic malalignment. Skull base and vertebrae: No acute fracture. No primary bone lesion or focal pathologic process. Soft tissues and spinal canal: No prevertebral fluid or swelling. No visible canal hematoma. Disc levels: Mild right neuroforaminal stenosis at C3-C4 due to facet uncovertebral hypertrophy. Mild disc height loss with small posterior disc osteophyte complex and uncovertebral hypertrophy at C5-C6. Moderate disc height loss and facet uncovertebral hypertrophy at C6-C7. Moderate left neuroforaminal stenosis at C6-C7. Moderate right T1-T2 facet arthropathy. Upper chest: Negative. Other: None. IMPRESSION: 1.  No acute intracranial abnormality.  2. No acute facial fracture.  Right  malar soft tissue hematoma. 3. No acute cervical spine fracture. Mild-to-moderate degenerative disc disease at C5-C6 and C6-C7. Electronically Signed   By: Obie Dredge M.D.   On: 11/18/2017 11:23   Dg Humerus Right  Result Date: 11/18/2017 CLINICAL DATA:  Fall off of a platform.  Pain and bony deformity. EXAM: RIGHT HUMERUS - 2+ VIEW COMPARISON:  No recent prior. FINDINGS: There appears to be a fracture/dislocation involving the right elbow. Reference is made to the right elbow series report. Proximal humerus intact. IMPRESSION: 1. There appears to be a fracture dislocation involving the right elbow. Reference is made to right elbow series report of the same day. 2.  Proximal right humerus appears to be intact. 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 Abrasion to right cheek. EXAM: CT HEAD WITHOUT CONTRAST CT MAXILLOFACIAL WITHOUT CONTRAST CT CERVICAL SPINE WITHOUT CONTRAST TECHNIQUE: Multidetector CT imaging of the head, cervical spine, and maxillofacial structures were performed using the standard protocol without intravenous contrast. Multiplanar CT image reconstructions of the cervical spine and maxillofacial structures were also generated. COMPARISON:  None. FINDINGS: CT HEAD FINDINGS Brain: No evidence of acute infarction, hemorrhage, hydrocephalus, extra-axial collection or mass lesion/mass effect. Vascular: No hyperdense vessel or unexpected calcification. Skull: Normal. Negative for fracture or focal lesion. Other: None. CT MAXILLOFACIAL FINDINGS Osseous: No fracture or mandibular dislocation. No destructive process. Orbits: Negative. No traumatic or inflammatory finding. Sinuses: Clear. Soft tissues: Right malar soft tissue hematoma. CT CERVICAL SPINE FINDINGS Alignment: Straightening of the normal cervical lordosis. No traumatic malalignment. Skull base and vertebrae:  No acute fracture. No primary bone lesion or focal pathologic process. Soft tissues and spinal canal: No prevertebral fluid or swelling. No visible canal hematoma. Disc levels: Mild right neuroforaminal stenosis at C3-C4 due to facet uncovertebral hypertrophy. Mild disc height loss with small posterior disc osteophyte complex and uncovertebral hypertrophy at C5-C6. Moderate disc height loss and facet uncovertebral hypertrophy at C6-C7. Moderate left neuroforaminal stenosis at C6-C7. Moderate right T1-T2 facet arthropathy. Upper chest: Negative. Other: None. IMPRESSION: 1.  No acute intracranial abnormality. 2. No acute facial fracture.  Right malar soft tissue hematoma. 3. No acute cervical spine fracture. Mild-to-moderate degenerative disc disease at C5-C6 and C6-C7. Electronically Signed   By: Obie Dredge M.D.   On: 11/18/2017 11:23    Review of Systems  Constitutional: Negative for weight loss.  HENT: Negative for ear discharge, ear pain, hearing loss and tinnitus.   Eyes: Negative for blurred vision, double vision, photophobia and pain.  Respiratory: Negative for cough, sputum production and shortness of breath.   Cardiovascular: Negative for chest pain.  Gastrointestinal: Negative for abdominal pain, nausea and vomiting.  Genitourinary: Negative for dysuria, flank pain, frequency and urgency.  Musculoskeletal: Positive for joint pain (Bilateral elbows and wrists). Negative for back pain, falls, myalgias and neck pain.  Neurological: Negative for dizziness, tingling, sensory change, focal weakness, loss of consciousness and headaches.  Endo/Heme/Allergies: Does not bruise/bleed easily.  Psychiatric/Behavioral: Negative for depression, memory loss and substance abuse. The patient is not nervous/anxious.     Blood pressure (!) 178/83, pulse 70, temperature 98.3 F (36.8 C), temperature source Oral, resp. rate 16, SpO2 91 %. Physical Exam  Constitutional: He appears well-developed and  well-nourished. No distress.  HENT:  Head: Normocephalic and atraumatic.  Eyes: Conjunctivae are  normal. Right eye exhibits no discharge. Left eye exhibits no discharge. No scleral icterus.  Neck: Normal range of motion.  Cardiovascular: Normal rate and regular rhythm.  Respiratory: Effort normal. No respiratory distress.  Musculoskeletal:  Right shoulder, elbow, wrist, digits- no skin wounds, severe TTP elbow with swelling and deformity  Sens  Ax intact R/M/U paresthetic  Mot   Ax/ R/ PIN/ M/ AIN/ U intact but limited by pain  Rad 2+  Left shoulder, elbow, wrist, digits- no skin wounds, moderate TTP elbow/wrist, no instability, no blocks to motion  Sens  Ax intact R/M/U paresthetic  Mot   Ax/ R/ PIN/ M/ AIN/ U intact  Rad 2+  Neurological: He is alert.  Skin: Skin is warm and dry. He is not diaphoretic.  Psychiatric: He has a normal mood and affect. His behavior is normal.     Assessment/Plan Fall Right elbow fx/dislocation -- Will attempt closed reduction with conscious sedation in ED and splint. Plan for ORIF tomorrow with Dr. Jena Gauss. NPO after MN. Left elbow fx -- Likely ORIF tomorrow though may have delayed fixation. Splint. Bilateral wrist fxs -- Can likely  be treated non-operatively. Will get CT to delineate extent of fxs.    Freeman Caldron, PA-C Orthopedic Surgery [phone] 11/18/2017, 3:59 PM

## 2017-11-18 NOTE — ED Notes (Signed)
Ortho paged and Respiratory.

## 2017-11-18 NOTE — ED Provider Notes (Addendum)
  Physical Exam  BP (!) 178/105   Pulse 63   Temp 98.5 F (36.9 C) (Oral)   Resp 16   Wt 70.3 kg (155 lb)   SpO2 96%   BMI 24.28 kg/m   Physical Exam  Pt awake, alert, deformity of right elbow, brisk cap refill, sensation intact, normal respiratory effort, full mouth opening.    ED Course/Procedures     .Sedation Date/Time: 11/18/2017 4:42 PM Performed by: Mabe, Latanya MaudlinMartha L, MD Authorized by: Mabe, Latanya MaudlinMartha L, MD   Consent:    Consent obtained:  Verbal and written   Consent given by:  Patient   Risks discussed:  Allergic reaction, inadequate sedation, prolonged hypoxia resulting in organ damage, vomiting and respiratory compromise necessitating ventilatory assistance and intubation Universal protocol:    Immediately prior to procedure a time out was called: yes     Patient identity confirmation method:  Arm band and verbally with patient Indications:    Procedure performed:  Dislocation reduction   Procedure necessitating sedation performed by:  Different physician   Intended level of sedation:  Moderate (conscious sedation) Pre-sedation assessment:    Time since last food or drink:  At least 4 hours   ASA classification: class 1 - normal, healthy patient     Neck mobility: normal     Mouth opening:  3 or more finger widths   Mallampati score:  I - soft palate, uvula, fauces, pillars visible   Pre-sedation assessments completed and reviewed: airway patency, cardiovascular function, hydration status, mental status, nausea/vomiting, pain level and respiratory function   Immediate pre-procedure details:    Reviewed: vital signs and NPO status     Verified: bag valve mask available, emergency equipment available, intubation equipment available, IV patency confirmed, oxygen available and suction available   Procedure details (see MAR for exact dosages):    Preoxygenation:  Nasal cannula   Sedation:  Propofol   Intra-procedure monitoring:  Blood pressure monitoring, cardiac monitor,  continuous capnometry, continuous pulse oximetry, frequent LOC assessments and frequent vital sign checks   Total Provider sedation time (minutes):  20 Post-procedure details:    Attendance: Constant attendance by certified staff until patient recovered     Recovery: Patient returned to pre-procedure baseline     Post-sedation assessments completed and reviewed: airway patency, cardiovascular function, hydration status, mental status, nausea/vomiting, pain level and respiratory function     Patient tolerance:  Tolerated well, no immediate complications Comments:     IV flushed easily prior to meds given, however patient had no response to propofol and it was determined that IV infiltrated- would not draw blood back- new IV placed and propofol given without incident.  Pharm D present and advised elevation and cold compresses for infiltration.  Should not have effects of increased sedation per PharmD.      MDM  [facility].  Right elbow fracture/dislocation reduced under procedural sedation by ortho.  Pt to be admitted to ortho service for surgical repair of elbow fracture tomorrow.         Phillis HaggisMabe, Martha L, MD 11/18/17 1656    Phillis HaggisMabe, Martha L, MD 11/18/17 2037

## 2017-11-18 NOTE — H&P (View-Only) (Signed)
Randy Barker is an 52 y.o. male.   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 He put his arms out to break his fall and had immediate pain in both. [facility] where x-rays showed bilateral elbow and wrist fxs with the right elbow dislocated. Orthopedic surgery was consulted and the patient was transferred [facility] for treatment by the traumatic orthopedic specialist. He is Spanish-speaking only and ambidextrous.  Past Medical History:  Diagnosis Date  . GERD (gastroesophageal reflux disease)    occ  . [id number](440.1Headache(784.0)     Past Surgical History:  Procedure Laterality Date  . INGUINAL HERNIA REPAIR Bilateral 01/25/2013   Procedure: LAPAROSCOPIC BILATERAL INGUINAL HERNIA REPAIR;  Surgeon: Axel FillerArmando Ramirez, MD;  Location: [facility] OR;  Service: General;  Laterality: Bilateral;  . INSERTION OF MESH Bilateral 01/25/2013   Procedure: INSERTION OF MESH;  Surgeon: Axel FillerArmando Ramirez, MD;  Location: [facility] OR;  Service: General;  Laterality: Bilateral;    History reviewed. No pertinent family history. Social History:  reports that he quit smoking about 7 years ago. His smoking use included cigarettes. He has a 2.50 pack-year smoking history. he has never used smokeless tobacco. He reports that he does not drink alcohol or use drugs.  Allergies: No Known Allergies   (Not in a hospital admission)  No results found for this or any previous visit (from the past 48 hour(s)). 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 FINDINGS: There is fracture of the radial head with rotation and displacement of the radial head. There is a large left elbow joint effusion present displacing anterior and posterior fat pads. Alignment is normal. No additional fracture is seen. IMPRESSION: Fracture of the radial head with displacement and rotation of the  head. Large left elbow joint effusion. Electronically Signed   By: Dwyane DeePaul  Barry M.D.   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 FINDINGS: There is fracture dislocation of the right elbow. Fractures appear to involve the radial head, comminuted fracture of the proximal ulna with posterior dislocation and considerable soft tissue swelling. IMPRESSION: Fracture dislocation of the right elbow Electronically Signed   By: Dwyane DeePaul  Barry M.D.   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 Over the dorsum of the wrist, there is a bony fragment present presumably from a carpal bone. CT of the wrist may be helpful to assess fracture location. Otherwise alignment is normal. The distal radius and ulna are intact. IMPRESSION: Apparent fracture fragment over the dorsum of the wrist presumably from a carpal bone. Recommend CT of the wrist to assess further. Electronically Signed   By: Dwyane DeePaul  Barry M.D.   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 FINDINGS: The distal left radius and ulna are intact. However there is a small bony density  over the ulnar aspect of the wrist dorsally probably a small avulsion fragment from the triquetrum bone. There is soft tissue swelling over the dorsum of the wrist. Alignment is normal. IMPRESSION: Suspect small avulsion fracture fragment over the dorsum of the wrist possibly from the triquetrum bone. CT of the wrist may be helpful to assess this fracture further.  Electronically Signed   By: Dwyane Dee M.D.   On: 11/18/2017 12:06   Ct Head Wo Contrast  Result Date: 11/18/2017 CLINICAL DATA:  Fall at work on [employer] representative site. Abrasion to right cheek. EXAM: CT HEAD WITHOUT CONTRAST CT MAXILLOFACIAL WITHOUT CONTRAST CT CERVICAL SPINE WITHOUT CONTRAST TECHNIQUE: Multidetector CT imaging of the head, cervical spine, and maxillofacial structures were performed using the standard protocol without intravenous contrast. Multiplanar CT image reconstructions of the cervical spine and maxillofacial structures were also generated. COMPARISON:  None. FINDINGS: CT HEAD FINDINGS Brain: No evidence of acute infarction, hemorrhage, hydrocephalus, extra-axial collection or mass lesion/mass effect. Vascular: No hyperdense vessel or unexpected calcification. Skull: Normal. Negative for fracture or focal lesion. Other: None. CT MAXILLOFACIAL FINDINGS Osseous: No fracture or mandibular dislocation. No destructive process. Orbits: Negative. No traumatic or inflammatory finding. Sinuses: Clear. Soft tissues: Right malar soft tissue hematoma. CT CERVICAL SPINE FINDINGS Alignment: Straightening of the normal cervical lordosis. No traumatic malalignment. Skull base and vertebrae: No acute fracture. No primary bone lesion or focal pathologic process. Soft tissues and spinal canal: No prevertebral fluid or swelling. No visible canal hematoma. Disc levels: Mild right neuroforaminal stenosis at C3-C4 due to facet uncovertebral hypertrophy. Mild disc height loss with small posterior disc osteophyte complex and uncovertebral hypertrophy at C5-C6. Moderate disc height loss and facet uncovertebral hypertrophy at C6-C7. Moderate left neuroforaminal stenosis at C6-C7. Moderate right T1-T2 facet arthropathy. Upper chest: Negative. Other: None. IMPRESSION: 1.  No acute intracranial abnormality. 2. No acute facial fracture.  Right malar soft tissue hematoma. 3. No acute cervical spine fracture.  Mild-to-moderate degenerative disc disease at C5-C6 and C6-C7. Electronically Signed   By: Obie Dredge M.D.   On: 11/18/2017 11:23   Ct Cervical Spine Wo Contrast  Result Date: 11/18/2017 CLINICAL DATA:  Fall at work on [employer] representative site. Abrasion to right cheek. EXAM: CT HEAD WITHOUT CONTRAST CT MAXILLOFACIAL WITHOUT CONTRAST CT CERVICAL SPINE WITHOUT CONTRAST TECHNIQUE: Multidetector CT imaging of the head, cervical spine, and maxillofacial structures were performed using the standard protocol without intravenous contrast. Multiplanar CT image reconstructions of the cervical spine and maxillofacial structures were also generated. COMPARISON:  None. FINDINGS: CT HEAD FINDINGS Brain: No evidence of acute infarction, hemorrhage, hydrocephalus, extra-axial collection or mass lesion/mass effect. Vascular: No hyperdense vessel or unexpected calcification. Skull: Normal. Negative for fracture or focal lesion. Other: None. CT MAXILLOFACIAL FINDINGS Osseous: No fracture or mandibular dislocation. No destructive process. Orbits: Negative. No traumatic or inflammatory finding. Sinuses: Clear. Soft tissues: Right malar soft tissue hematoma. CT CERVICAL SPINE FINDINGS Alignment: Straightening of the normal cervical lordosis. No traumatic malalignment. Skull base and vertebrae: No acute fracture. No primary bone lesion or focal pathologic process. Soft tissues and spinal canal: No prevertebral fluid or swelling. No visible canal hematoma. Disc levels: Mild right neuroforaminal stenosis at C3-C4 due to facet uncovertebral hypertrophy. Mild disc height loss with small posterior disc osteophyte complex and uncovertebral hypertrophy at C5-C6. Moderate disc height loss and facet uncovertebral hypertrophy at C6-C7. Moderate left neuroforaminal stenosis at C6-C7. Moderate right T1-T2 facet arthropathy. Upper chest: Negative. Other: None. IMPRESSION: 1.  No acute intracranial abnormality.  2. No acute facial fracture.  Right  malar soft tissue hematoma. 3. No acute cervical spine fracture. Mild-to-moderate degenerative disc disease at C5-C6 and C6-C7. Electronically Signed   By: Obie Dredge M.D.   On: 11/18/2017 11:23   Dg Humerus Right  Result Date: 11/18/2017 CLINICAL DATA:  Fall off of a platform.  Pain and bony deformity. EXAM: RIGHT HUMERUS - 2+ VIEW COMPARISON:  No recent prior. FINDINGS: There appears to be a fracture/dislocation involving the right elbow. Reference is made to the right elbow series report. Proximal humerus intact. IMPRESSION: 1. There appears to be a fracture dislocation involving the right elbow. Reference is made to right elbow series report of the same day. 2.  Proximal right humerus appears to be intact. 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 Abrasion to right cheek. EXAM: CT HEAD WITHOUT CONTRAST CT MAXILLOFACIAL WITHOUT CONTRAST CT CERVICAL SPINE WITHOUT CONTRAST TECHNIQUE: Multidetector CT imaging of the head, cervical spine, and maxillofacial structures were performed using the standard protocol without intravenous contrast. Multiplanar CT image reconstructions of the cervical spine and maxillofacial structures were also generated. COMPARISON:  None. FINDINGS: CT HEAD FINDINGS Brain: No evidence of acute infarction, hemorrhage, hydrocephalus, extra-axial collection or mass lesion/mass effect. Vascular: No hyperdense vessel or unexpected calcification. Skull: Normal. Negative for fracture or focal lesion. Other: None. CT MAXILLOFACIAL FINDINGS Osseous: No fracture or mandibular dislocation. No destructive process. Orbits: Negative. No traumatic or inflammatory finding. Sinuses: Clear. Soft tissues: Right malar soft tissue hematoma. CT CERVICAL SPINE FINDINGS Alignment: Straightening of the normal cervical lordosis. No traumatic malalignment. Skull base and vertebrae:  No acute fracture. No primary bone lesion or focal pathologic process. Soft tissues and spinal canal: No prevertebral fluid or swelling. No visible canal hematoma. Disc levels: Mild right neuroforaminal stenosis at C3-C4 due to facet uncovertebral hypertrophy. Mild disc height loss with small posterior disc osteophyte complex and uncovertebral hypertrophy at C5-C6. Moderate disc height loss and facet uncovertebral hypertrophy at C6-C7. Moderate left neuroforaminal stenosis at C6-C7. Moderate right T1-T2 facet arthropathy. Upper chest: Negative. Other: None. IMPRESSION: 1.  No acute intracranial abnormality. 2. No acute facial fracture.  Right malar soft tissue hematoma. 3. No acute cervical spine fracture. Mild-to-moderate degenerative disc disease at C5-C6 and C6-C7. Electronically Signed   By: Obie Dredge M.D.   On: 11/18/2017 11:23    Review of Systems  Constitutional: Negative for weight loss.  HENT: Negative for ear discharge, ear pain, hearing loss and tinnitus.   Eyes: Negative for blurred vision, double vision, photophobia and pain.  Respiratory: Negative for cough, sputum production and shortness of breath.   Cardiovascular: Negative for chest pain.  Gastrointestinal: Negative for abdominal pain, nausea and vomiting.  Genitourinary: Negative for dysuria, flank pain, frequency and urgency.  Musculoskeletal: Positive for joint pain (Bilateral elbows and wrists). Negative for back pain, falls, myalgias and neck pain.  Neurological: Negative for dizziness, tingling, sensory change, focal weakness, loss of consciousness and headaches.  Endo/Heme/Allergies: Does not bruise/bleed easily.  Psychiatric/Behavioral: Negative for depression, memory loss and substance abuse. The patient is not nervous/anxious.     Blood pressure (!) 178/83, pulse 70, temperature 98.3 F (36.8 C), temperature source Oral, resp. rate 16, SpO2 91 %. Physical Exam  Constitutional: He appears well-developed and  well-nourished. No distress.  HENT:  Head: Normocephalic and atraumatic.  Eyes: Conjunctivae are  normal. Right eye exhibits no discharge. Left eye exhibits no discharge. No scleral icterus.  Neck: Normal range of motion.  Cardiovascular: Normal rate and regular rhythm.  Respiratory: Effort normal. No respiratory distress.  Musculoskeletal:  Right shoulder, elbow, wrist, digits- no skin wounds, severe TTP elbow with swelling and deformity  Sens  Ax intact R/M/U paresthetic  Mot   Ax/ R/ PIN/ M/ AIN/ U intact but limited by pain  Rad 2+  Left shoulder, elbow, wrist, digits- no skin wounds, moderate TTP elbow/wrist, no instability, no blocks to motion  Sens  Ax intact R/M/U paresthetic  Mot   Ax/ R/ PIN/ M/ AIN/ U intact  Rad 2+  Neurological: He is alert.  Skin: Skin is warm and dry. He is not diaphoretic.  Psychiatric: He has a normal mood and affect. His behavior is normal.     Assessment/Plan Fall Right elbow fx/dislocation -- Will attempt closed reduction with conscious sedation in ED and splint. Plan for ORIF tomorrow with Dr. Jena Gauss. NPO after MN. Left elbow fx -- Likely ORIF tomorrow though may have delayed fixation. Splint. Bilateral wrist fxs -- Can likely  be treated non-operatively. Will get CT to delineate extent of fxs.    Freeman Caldron, PA-C Orthopedic Surgery [phone] 11/18/2017, 3:59 PM

## 2017-11-18 NOTE — ED Triage Notes (Signed)
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 Patient slip and fall putting dry wall up. Pt right arm swollen. Pt given 200 mcg of fentanyl per ems.

## 2017-11-18 NOTE — Care Management Note (Signed)
Case Management Note  CM consulted for assistance with how the pt can get in touch with worker's compensation.  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  No further CM needs noted at this time.

## 2017-11-18 NOTE — ED Notes (Signed)
CONSENT OBTAINED. Pt gives Nephew at bedside permission to sign

## 2017-11-18 NOTE — ED Notes (Signed)
Bed: WA03 Expected date:  Expected time:  Means of arrival:  Comments: EMS-fall-arm injury

## 2017-11-18 NOTE — Progress Notes (Signed)
Orthopedic Tech Progress Note Patient Details:  Hortense RamalFidel Lacko 1965/11/01 [id number]  Ortho Devices Type of Ortho Device: Long arm splint(Long Arm Spling x2) Ortho Device/Splint Location: Assisted Cheri FowlerMike jeffery PA with with 2 long arm splints Left and right arms/ Bilateral Long arm splints. Ortho Device/Splint Interventions: Application   Post Interventions Patient Tolerated: Well   Alvina ChouWilliams, Sonya Pucci C 11/18/2017, 5:01 PM

## 2017-11-18 NOTE — ED Notes (Signed)
ED Provider at bedside with Emelia LoronMichael Jeffery's PA

## 2017-11-18 NOTE — ED Notes (Signed)
[facility] [employer] [facility]  Patient was hanging drywall and fell resulting in bilateral elbow and wrist fractures.  Head and Neck cleared by CT.  Patient does not speak [place] iPad interpreter for spanish used.

## 2017-11-18 NOTE — ED Notes (Signed)
Ordered reg diet:  bbq sandwich, ffs, etc

## 2017-11-18 NOTE — ED Provider Notes (Signed)
[name] Provider Note   CSN: [id number] Arrival date & time: 11/18/17  7829     History   Chief Complaint No chief complaint on file.   HPI Randy Barker is a 52 y.o. male.  HPI   Patient is a 52 year old male with a history of GERD, and headaches presenting for a fall at work.  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  Patient reports he is unsure exactly the mechanism of his fall, however he has a greatest pain in both arms at this time.  Patient does not believe he hit his head, however he does report he has has an abrasion over the right face.  Patient denies loss of consciousness, retrograde amnesia, distal paresthesias, or nausea or vomtiing.  Patient does not know when his last tetanus shot was.  History and examination performed with the assistance of [employer] Spanish interpreter, [place], #[id number].  Past Medical History:  Diagnosis Date  . GERD (gastroesophageal reflux disease)    occ  . Headache(784.0)     There are no active problems to display for this patient.   Past Surgical History:  Procedure Laterality Date  . INGUINAL HERNIA REPAIR Bilateral 01/25/2013   Procedure: LAPAROSCOPIC BILATERAL INGUINAL HERNIA REPAIR;  Surgeon: Axel Filler, MD;  Location: [facility] OR;  Service: General;  Laterality: Bilateral;  . INSERTION OF MESH Bilateral 01/25/2013   Procedure: INSERTION OF MESH;  Surgeon: Axel Filler, MD;  Location: [facility] OR;  Service: General;  Laterality: Bilateral;       Home Medications    Prior to Admission medications   Medication Sig Start Date End Date Taking? Authorizing Provider  Pseudoephedrine-Ibuprofen 30-200 MG TABS Take 2 tablets by mouth 2 (two) times daily as needed (cold).   Yes [provider]    Family History History reviewed. No pertinent family history.  Social  History Social History   Tobacco Use  . Smoking status: Former Smoker    Packs/day: 0.25    Years: 10.00    Pack years: 2.50    Types: Cigarettes    Last attempt to quit: 01/21/2010    Years since quitting: 7.8  . Smokeless tobacco: Never Used  Substance Use Topics  . Alcohol use: No  . Drug use: No     Allergies   Patient has no known allergies.   Review of Systems Review of Systems  Gastrointestinal: Negative for nausea and vomiting.  Musculoskeletal: Positive for arthralgias and joint swelling. Negative for neck pain.  Skin: Negative for wound.  Neurological: Positive for headaches. Negative for syncope.  All other systems reviewed and are negative.    Physical Exam Updated Vital Signs BP (!) 166/98   Pulse 65   Temp 98.3 F (36.8 C) (Oral)   Resp 13   SpO2 97%   Physical Exam  Constitutional: He appears well-developed and well-nourished. No distress.  HENT:  Head: Normocephalic and atraumatic.  Mouth/Throat: Oropharynx is clear and moist.  No hemotympanum.  No battle sign.  Eyes: Conjunctivae and EOM are normal. Pupils are equal, round, and reactive to light.  Neck: Normal range of motion. Neck supple.  Cardiovascular: Normal rate, regular rhythm, S1 normal and S2 normal.  No murmur heard. Pulmonary/Chest: Effort normal and breath sounds normal. He has no wheezes. He has no rales.  Abdominal: Soft. He exhibits no distension. There  is no tenderness. There is no guarding.  Musculoskeletal: He exhibits edema and deformity.  Patient large deformity just distal to the right elbow.  Right upper extremity neurovascularly intact. No midline cervical spine tenderness.  Lymphadenopathy:    He has no cervical adenopathy.  Neurological: He is alert.  Neurologic exam limited by deformity of right arm. Mental Status:  Alert, oriented, thought content appropriate, able to give a coherent history. Speech fluent without evidence of aphasia. Able to follow 2 step  commands without difficulty.  Cranial Nerves:  II:  Peripheral visual fields grossly normal, pupils equal, round, reactive to light III,IV, VI: ptosis not present, extra-ocular motions intact bilaterally  V,VII: smile symmetric, facial light touch sensation equal VIII: hearing grossly normal to voice  X: uvula elevates symmetrically  XI: bilateral shoulder shrug symmetric and strong XII: midline tongue extension without fassiculations Motor:  Normal tone. 5/5 in upper and lower extremities bilaterally including strong and equal grip strength and dorsiflexion/plantar flexion Sensory: Pinprick and light touch normal in all extremities.  Deep Tendon Reflexes: Deferred in RUE due to injury Cerebellar: Unable  Gait: normal gait and balance Stance: Unable to assess pronator drift due to injury CV: distal pulses palpable throughout    Skin: Skin is warm and dry. No rash noted. No erythema.  Psychiatric: He has a normal mood and affect. His behavior is normal. Judgment and thought content normal.  Nursing note and vitals reviewed.    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 FINDINGS: There is fracture of the radial head with rotation and displacement of the radial head. There is a large left elbow joint effusion present displacing anterior and posterior fat pads. Alignment is normal. No additional fracture is seen. IMPRESSION: Fracture of the radial head with displacement and rotation of the head. Large left elbow joint effusion. Electronically Signed   By: Dwyane Dee M.D.   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 FINDINGS: There is fracture dislocation of the right elbow. Fractures appear to involve the radial head, comminuted fracture of the proximal ulna with posterior dislocation and considerable soft tissue swelling. IMPRESSION: Fracture dislocation of the right elbow Electronically Signed   By: Dwyane Dee M.D.   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 Over the dorsum of the wrist, there is a bony fragment present presumably from a carpal bone. CT of the wrist may be helpful to assess fracture location. Otherwise alignment is normal. The distal radius and ulna are intact. IMPRESSION: Apparent fracture fragment over the dorsum of the wrist presumably from a carpal bone. Recommend CT of the wrist to assess further. Electronically Signed   By: Dwyane Dee M.D.   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 FINDINGS: The distal left radius and ulna are intact. However there is a small bony density over the ulnar aspect of the wrist dorsally probably a small avulsion fragment from the triquetrum bone. There is soft tissue swelling over the dorsum of the wrist. Alignment is normal. IMPRESSION: Suspect small avulsion fracture fragment over the dorsum of the wrist possibly from the triquetrum bone. CT of the wrist may be helpful to assess this fracture further. Electronically Signed   By: Dwyane DeePaul  Barry M.D.   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 Abrasion to right cheek.  EXAM: CT HEAD WITHOUT CONTRAST CT MAXILLOFACIAL WITHOUT CONTRAST CT CERVICAL SPINE WITHOUT CONTRAST TECHNIQUE: Multidetector CT imaging of the head, cervical spine, and maxillofacial structures were performed using the standard protocol without intravenous contrast. Multiplanar CT image reconstructions of the cervical spine and maxillofacial structures were also generated. COMPARISON:  None. FINDINGS: CT HEAD FINDINGS Brain: No evidence of acute infarction, hemorrhage, hydrocephalus, extra-axial collection or mass lesion/mass effect. Vascular: No hyperdense vessel or unexpected calcification. Skull: Normal. Negative for fracture or focal lesion. Other: None. CT MAXILLOFACIAL FINDINGS Osseous: No fracture or mandibular dislocation. No destructive process. Orbits: Negative. No traumatic or inflammatory finding. Sinuses: Clear. Soft tissues: Right malar soft tissue hematoma. CT CERVICAL SPINE FINDINGS Alignment: Straightening of the normal cervical lordosis. No traumatic malalignment. Skull base and vertebrae: No acute fracture. No primary bone lesion or focal pathologic process. Soft tissues and spinal canal: No prevertebral fluid or swelling. No visible canal hematoma. Disc levels: Mild right neuroforaminal stenosis at C3-C4 due to facet uncovertebral hypertrophy. Mild disc height loss with small posterior disc osteophyte complex and uncovertebral hypertrophy at C5-C6. Moderate disc height loss and facet uncovertebral hypertrophy at C6-C7. Moderate left neuroforaminal stenosis at C6-C7. Moderate right T1-T2 facet arthropathy. Upper chest: Negative. Other: None. IMPRESSION: 1.  No acute intracranial abnormality. 2. No acute facial fracture.  Right malar soft tissue hematoma. 3. No acute cervical spine fracture. Mild-to-moderate degenerative disc disease at C5-C6 and C6-C7. Electronically Signed   By: Obie DredgeWilliam T Derry M.D.   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 Abrasion to right cheek. EXAM: CT HEAD WITHOUT CONTRAST CT MAXILLOFACIAL WITHOUT CONTRAST CT CERVICAL SPINE WITHOUT CONTRAST TECHNIQUE: Multidetector CT imaging of the head, cervical spine, and maxillofacial structures were performed using the standard protocol without intravenous contrast. Multiplanar CT image reconstructions of the cervical spine and maxillofacial structures were also generated. COMPARISON:  None. FINDINGS: CT HEAD FINDINGS Brain: No evidence of acute infarction, hemorrhage, hydrocephalus, extra-axial collection or mass lesion/mass effect. Vascular: No hyperdense vessel or unexpected calcification. Skull: Normal. Negative for fracture or focal lesion. Other: None. CT MAXILLOFACIAL FINDINGS Osseous: No fracture or mandibular dislocation. No destructive process. Orbits: Negative. No traumatic or inflammatory finding. Sinuses: Clear. Soft tissues: Right malar soft tissue hematoma. CT CERVICAL SPINE FINDINGS Alignment: Straightening of the normal cervical lordosis. No traumatic malalignment. Skull base and vertebrae: No acute fracture. No primary bone lesion or focal pathologic process. Soft tissues and spinal canal: No prevertebral fluid or swelling. No visible canal hematoma. Disc levels: Mild right neuroforaminal stenosis at C3-C4 due to facet uncovertebral hypertrophy. Mild disc height loss with small posterior disc osteophyte complex and uncovertebral hypertrophy  at C5-C6. Moderate disc height loss and facet uncovertebral hypertrophy at C6-C7. Moderate left neuroforaminal stenosis at C6-C7. Moderate right T1-T2 facet arthropathy. Upper chest: Negative. Other: None. IMPRESSION: 1.  No acute intracranial abnormality. 2. No acute facial fracture.  Right malar soft tissue hematoma. 3. No acute cervical spine fracture. Mild-to-moderate degenerative disc disease at C5-C6 and C6-C7. Electronically Signed   By: Obie Dredge M.D.   On: 11/18/2017 11:23   Dg Humerus  Right  Result Date: 11/18/2017 CLINICAL DATA:  Fall off of a platform.  Pain and bony deformity. EXAM: RIGHT HUMERUS - 2+ VIEW COMPARISON:  No recent prior. FINDINGS: There appears to be a fracture/dislocation involving the right elbow. Reference is made to the right elbow series report. Proximal humerus intact. IMPRESSION: 1. There appears to be a fracture dislocation involving the right elbow. Reference is made to right elbow series report of the same day. 2.  Proximal right humerus appears to be intact. 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 Abrasion to right cheek. EXAM: CT HEAD WITHOUT CONTRAST CT MAXILLOFACIAL WITHOUT CONTRAST CT CERVICAL SPINE WITHOUT CONTRAST TECHNIQUE: Multidetector CT imaging of the head, cervical spine, and maxillofacial structures were performed using the standard protocol without intravenous contrast. Multiplanar CT image reconstructions of the cervical spine and maxillofacial structures were also generated. COMPARISON:  None. FINDINGS: CT HEAD FINDINGS Brain: No evidence of acute infarction, hemorrhage, hydrocephalus, extra-axial collection or mass lesion/mass effect. Vascular: No hyperdense vessel or unexpected calcification. Skull: Normal. Negative for fracture or focal lesion. Other: None. CT MAXILLOFACIAL FINDINGS Osseous: No fracture or mandibular dislocation. No destructive process. Orbits: Negative. No traumatic or inflammatory finding. Sinuses: Clear. Soft tissues: Right malar soft tissue hematoma. CT CERVICAL SPINE FINDINGS Alignment: Straightening of the normal cervical lordosis. No traumatic malalignment. Skull base and vertebrae: No acute fracture. No primary bone lesion or focal pathologic process. Soft tissues and spinal canal: No prevertebral fluid or swelling. No visible canal hematoma. Disc levels: Mild right neuroforaminal stenosis at  C3-C4 due to facet uncovertebral hypertrophy. Mild disc height loss with small posterior disc osteophyte complex and uncovertebral hypertrophy at C5-C6. Moderate disc height loss and facet uncovertebral hypertrophy at C6-C7. Moderate left neuroforaminal stenosis at C6-C7. Moderate right T1-T2 facet arthropathy. Upper chest: Negative. Other: None. IMPRESSION: 1.  No acute intracranial abnormality. 2. No acute facial fracture.  Right malar soft tissue hematoma. 3. No acute cervical spine fracture. Mild-to-moderate degenerative disc disease at C5-C6 and C6-C7. Electronically Signed   By: Obie Dredge M.D.   On: 11/18/2017 11:23    Procedures Procedures (including critical care time)  Medications Ordered in ED Medications  Morphine Sulfate (PF) SOLN 10 mg (10 mg Intravenous Given 11/18/17 1353)  HYDROmorphone (DILAUDID) injection 1 mg (1 mg Intravenous Given 11/18/17 1047)  Tdap (BOOSTRIX) injection 0.5 mL (0.5 mLs Intramuscular Given 11/18/17 1047)  HYDROmorphone (DILAUDID) injection 1 mg (1 mg Intravenous Given 11/18/17 1231)     Initial Impression / Assessment and Plan / ED Course  I have reviewed the triage vital signs and the nursing notes.  Pertinent labs & imaging results that were available during my care of the patient were reviewed by me and considered in my medical decision making (see chart for details).     Patient is nontoxic appearing and neurologically intact.  Patient exhibits obvious deformity of the right elbow, as  well as tenderness along the radial head of the left elbow all the way down to the left wrist.  Will obtain imaging of right humerus, right elbow, right forearm and wrist, left elbow, left forearm and wrist.  Additionally, given the patient has distracting injury, fall from greater than 10 feet, and evidence of facial injury, will obtain CT head, C-spine, and maxillofacial.  Patient placed in c-collar.  Dilaudid for pain control.  There is fracture dislocation of the  right elbow.  Additionally, there are bony fragments of the bilateral wrists that may be better assessed on CT per radiology recommendation.  A consult placed to orthopedic surgery, and will defer CT imaging decisions to this management team.   Earney Hamburg, PA-C at [facility] made aware patient, and patient to be transferred to [facility] for orthopedic surgery.  Patient cleared of head, C-spine, maxillofacial pathology.  Final Clinical Impressions(s) / ED Diagnoses   Final diagnoses:  Closed fracture dislocation of right elbow, initial encounter    ED Discharge Orders    None       Delia Chimes 11/18/17 1537    Gerhard Munch, MD 11/22/17 Windell Moment

## 2017-11-18 NOTE — ED Notes (Addendum)
Maylon PeppersJeffery Ortho PA and Mabe ED MD at bedside

## 2017-11-19 ENCOUNTER — Encounter (HOSPITAL_COMMUNITY): Payer: Self-pay | Admitting: *Deleted

## 2017-11-19 ENCOUNTER — Inpatient Hospital Stay (HOSPITAL_COMMUNITY): Payer: Medicaid Other | Admitting: Anesthesiology

## 2017-11-19 ENCOUNTER — Inpatient Hospital Stay (HOSPITAL_COMMUNITY): Payer: Medicaid Other

## 2017-11-19 ENCOUNTER — Encounter (HOSPITAL_COMMUNITY)
Admission: EM | Disposition: A | Discharge status: Home / Self Care | Payer: Self-pay | Source: Home / Self Care | Attending: Student

## 2017-11-19 HISTORY — PX: ORIF ELBOW FRACTURE: SHX5031

## 2017-11-19 LAB — SURGICAL PCR SCREEN
MRSA, PCR: NEGATIVE
STAPHYLOCOCCUS AUREUS: POSITIVE — AB

## 2017-11-19 LAB — POCT I-STAT 4, (NA,K, GLUC, HGB,HCT)
GLUCOSE: 105 mg/dL — AB (ref 65–99)
HCT: 34 % — ABNORMAL LOW (ref 39.0–52.0)
Hemoglobin: 11.6 g/dL — ABNORMAL LOW (ref 13.0–17.0)
POTASSIUM: 4 mmol/L (ref 3.5–5.1)
Sodium: 141 mmol/L (ref 135–145)

## 2017-11-19 LAB — HIV ANTIBODY (ROUTINE TESTING W REFLEX): HIV SCREEN 4TH GENERATION: NONREACTIVE

## 2017-11-19 SURGERY — OPEN REDUCTION INTERNAL FIXATION (ORIF) ELBOW/OLECRANON FRACTURE
Anesthesia: General | Site: Elbow | Laterality: Right

## 2017-11-19 MED ORDER — PROPOFOL 10 MG/ML IV BOLUS
INTRAVENOUS | Status: AC
  Filled 2017-11-19: qty 20

## 2017-11-19 MED ORDER — LACTATED RINGERS IV SOLN
INTRAVENOUS | Status: DC
Start: 2017-11-19 — End: 2017-11-19
  Administered 2017-11-19 (×2): via INTRAVENOUS

## 2017-11-19 MED ORDER — TOBRAMYCIN SULFATE 1.2 G IJ SOLR
INTRAMUSCULAR | Status: AC
  Filled 2017-11-19: qty 1.2

## 2017-11-19 MED ORDER — LIDOCAINE HCL (CARDIAC) 20 MG/ML IV SOLN
INTRAVENOUS | Status: DC | PRN
  Administered 2017-11-19: 60 mg via INTRAVENOUS

## 2017-11-19 MED ORDER — 0.9 % SODIUM CHLORIDE (POUR BTL) OPTIME
TOPICAL | Status: DC | PRN
Start: 2017-11-19 — End: 2017-11-19
  Administered 2017-11-19: 1000 mL

## 2017-11-19 MED ORDER — HYDROMORPHONE HCL 1 MG/ML IJ SOLN
INTRAMUSCULAR | Status: AC
  Administered 2017-11-19: 0.5 mg via INTRAVENOUS
  Filled 2017-11-19: qty 1

## 2017-11-19 MED ORDER — LACTATED RINGERS IV SOLN
INTRAVENOUS | Status: DC | PRN
  Administered 2017-11-19: 09:00:00 via INTRAVENOUS

## 2017-11-19 MED ORDER — FENTANYL CITRATE (PF) 250 MCG/5ML IJ SOLN
INTRAMUSCULAR | Status: AC
Start: 2017-11-19 — End: 2017-11-19
  Filled 2017-11-19: qty 5

## 2017-11-19 MED ORDER — PHENYLEPHRINE 40 MCG/ML (10ML) SYRINGE FOR IV PUSH (FOR BLOOD PRESSURE SUPPORT)
PREFILLED_SYRINGE | INTRAVENOUS | Status: AC
  Filled 2017-11-19: qty 10

## 2017-11-19 MED ORDER — ROCURONIUM BROMIDE 100 MG/10ML IV SOLN
INTRAVENOUS | Status: DC | PRN
  Administered 2017-11-19: 50 mg via INTRAVENOUS
  Administered 2017-11-19: 20 mg via INTRAVENOUS
  Administered 2017-11-19: 30 mg via INTRAVENOUS

## 2017-11-19 MED ORDER — ONDANSETRON HCL 4 MG/2ML IJ SOLN
INTRAMUSCULAR | Status: DC | PRN
  Administered 2017-11-19: 4 mg via INTRAVENOUS

## 2017-11-19 MED ORDER — MIDAZOLAM HCL 2 MG/2ML IJ SOLN
INTRAMUSCULAR | Status: AC
  Filled 2017-11-19: qty 2

## 2017-11-19 MED ORDER — HYDROMORPHONE HCL 1 MG/ML IJ SOLN
0.2500 mg | INTRAMUSCULAR | Status: DC | PRN
  Administered 2017-11-19 (×2): 0.5 mg via INTRAVENOUS

## 2017-11-19 MED ORDER — PROMETHAZINE HCL 25 MG/ML IJ SOLN: 6.2500 mg | INTRAMUSCULAR | Status: DC | PRN

## 2017-11-19 MED ORDER — EPHEDRINE SULFATE 50 MG/ML IJ SOLN
INTRAMUSCULAR | Status: DC | PRN
  Administered 2017-11-19: 5 mg via INTRAVENOUS

## 2017-11-19 MED ORDER — PROPOFOL 10 MG/ML IV BOLUS
INTRAVENOUS | Status: DC | PRN
  Administered 2017-11-19: 150 mg via INTRAVENOUS

## 2017-11-19 MED ORDER — CEFAZOLIN SODIUM-DEXTROSE 2-4 GM/100ML-% IV SOLN
2.0000 g | Freq: Three times a day (TID) | INTRAVENOUS | Status: AC
  Administered 2017-11-19 – 2017-11-20 (×3): 2 g via INTRAVENOUS
  Filled 2017-11-19 (×3): qty 100

## 2017-11-19 MED ORDER — ONDANSETRON HCL 4 MG/2ML IJ SOLN
INTRAMUSCULAR | Status: AC
  Filled 2017-11-19: qty 2

## 2017-11-19 MED ORDER — DEXAMETHASONE SODIUM PHOSPHATE 10 MG/ML IJ SOLN
INTRAMUSCULAR | Status: DC | PRN
  Administered 2017-11-19: 10 mg via INTRAVENOUS

## 2017-11-19 MED ORDER — FENTANYL CITRATE (PF) 250 MCG/5ML IJ SOLN
INTRAMUSCULAR | Status: AC
  Filled 2017-11-19: qty 5

## 2017-11-19 MED ORDER — FENTANYL CITRATE (PF) 100 MCG/2ML IJ SOLN
INTRAMUSCULAR | Status: DC | PRN
  Administered 2017-11-19 (×6): 50 ug via INTRAVENOUS

## 2017-11-19 MED ORDER — MIDAZOLAM HCL 5 MG/5ML IJ SOLN
INTRAMUSCULAR | Status: DC | PRN
  Administered 2017-11-19: 2 mg via INTRAVENOUS

## 2017-11-19 MED ORDER — VANCOMYCIN HCL 1000 MG IV SOLR
INTRAVENOUS | Status: AC
  Filled 2017-11-19: qty 1000

## 2017-11-19 MED ORDER — PHENYLEPHRINE HCL 10 MG/ML IJ SOLN
INTRAMUSCULAR | Status: DC | PRN
  Administered 2017-11-19 (×2): 60 ug via INTRAVENOUS

## 2017-11-19 MED ORDER — CEFAZOLIN SODIUM 1 G IJ SOLR
INTRAMUSCULAR | Status: AC
  Filled 2017-11-19: qty 20

## 2017-11-19 MED ORDER — TOBRAMYCIN SULFATE 1.2 G IJ SOLR
INTRAMUSCULAR | Status: DC | PRN
  Administered 2017-11-19: 1.2 g via TOPICAL

## 2017-11-19 MED ORDER — SUGAMMADEX SODIUM 200 MG/2ML IV SOLN
INTRAVENOUS | Status: AC
  Filled 2017-11-19: qty 2

## 2017-11-19 MED ORDER — SUGAMMADEX SODIUM 200 MG/2ML IV SOLN
INTRAVENOUS | Status: DC | PRN
  Administered 2017-11-19: 150 mg via INTRAVENOUS

## 2017-11-19 MED ORDER — DEXAMETHASONE SODIUM PHOSPHATE 10 MG/ML IJ SOLN
INTRAMUSCULAR | Status: AC
  Filled 2017-11-19: qty 1

## 2017-11-19 MED ORDER — LIDOCAINE HCL (CARDIAC) 20 MG/ML IV SOLN
INTRAVENOUS | Status: AC
  Filled 2017-11-19: qty 5

## 2017-11-19 MED ORDER — VANCOMYCIN HCL 1000 MG IV SOLR
INTRAVENOUS | Status: DC | PRN
  Administered 2017-11-19: 1000 mg via TOPICAL

## 2017-11-19 MED ORDER — BACITRACIN ZINC 500 UNIT/GM EX OINT
TOPICAL_OINTMENT | CUTANEOUS | Status: AC
  Filled 2017-11-19: qty 28.35

## 2017-11-19 SURGICAL SUPPLY — 94 items
ANCHOR SUT 1.45 SZ 1 SHORT (Anchor) ×6 IMPLANT
BANDAGE ELASTIC 3 VELCRO ST LF (GAUZE/BANDAGES/DRESSINGS) ×3 IMPLANT
BANDAGE ELASTIC 4 VELCRO ST LF (GAUZE/BANDAGES/DRESSINGS) ×3 IMPLANT
BIT DRILL 2 FAST STEP (BIT) ×3 IMPLANT
BIT DRILL 2.0 (BIT) ×1
BIT DRILL 2.0MM (BIT) ×1
BIT DRILL 2.5X2.75 QC CALB (BIT) ×3 IMPLANT
BIT DRILL 2XNS DISP SS SM FRAG (BIT) ×1 IMPLANT
BIT DRILL CALIBRATED 2.7 (BIT) ×2 IMPLANT
BIT DRILL CALIBRATED 2.7MM (BIT) ×1
BIT DRL 2XNS DISP SS SM FRAG (BIT) ×1
BLADE AVERAGE 25MMX9MM (BLADE) ×1
BLADE AVERAGE 25X9 (BLADE) ×2 IMPLANT
BLADE CLIPPER SURG (BLADE) IMPLANT
BLADE SURG 10 STRL SS (BLADE) IMPLANT
BRUSH SCRUB SURG 4.25 DISP (MISCELLANEOUS) ×6 IMPLANT
CHLORAPREP W/TINT 26ML (MISCELLANEOUS) ×3 IMPLANT
CLEANER TIP ELECTROSURG 2X2 (MISCELLANEOUS) ×3 IMPLANT
CLOSURE WOUND 1/2 X4 (GAUZE/BANDAGES/DRESSINGS)
COVER SURGICAL LIGHT HANDLE (MISCELLANEOUS) ×3 IMPLANT
CUFF TOURNIQUET SINGLE 18IN (TOURNIQUET CUFF) IMPLANT
CUFF TOURNIQUET SINGLE 24IN (TOURNIQUET CUFF) ×3 IMPLANT
DECANTER SPIKE VIAL GLASS SM (MISCELLANEOUS) ×3 IMPLANT
DRAPE C-ARM 42X72 X-RAY (DRAPES) IMPLANT
DRAPE C-ARMOR (DRAPES) ×3 IMPLANT
DRAPE INCISE IOBAN 66X45 STRL (DRAPES) ×3 IMPLANT
DRAPE U-SHAPE 47X51 STRL (DRAPES) ×3 IMPLANT
DRSG ADAPTIC 3X8 NADH LF (GAUZE/BANDAGES/DRESSINGS) ×3 IMPLANT
DRSG EMULSION OIL 3X3 NADH (GAUZE/BANDAGES/DRESSINGS) IMPLANT
ELECT REM PT RETURN 9FT ADLT (ELECTROSURGICAL) ×3
ELECTRODE REM PT RTRN 9FT ADLT (ELECTROSURGICAL) ×1 IMPLANT
FACESHIELD WRAPAROUND (MASK) IMPLANT
GAUZE SPONGE 4X4 12PLY STRL LF (GAUZE/BANDAGES/DRESSINGS) ×3 IMPLANT
GLOVE BIO SURGEON STRL SZ7.5 (GLOVE) ×12 IMPLANT
GLOVE BIOGEL PI IND STRL 7.5 (GLOVE) ×1 IMPLANT
GLOVE BIOGEL PI INDICATOR 7.5 (GLOVE) ×2
GOWN STRL REUS W/ TWL LRG LVL3 (GOWN DISPOSABLE) ×2 IMPLANT
GOWN STRL REUS W/TWL LRG LVL3 (GOWN DISPOSABLE) ×4
HEAD EXPLOR 12X24MM (Orthopedic Implant) ×3 IMPLANT
KIT BASIN OR (CUSTOM PROCEDURE TRAY) ×3 IMPLANT
KIT ROOM TURNOVER OR (KITS) ×3 IMPLANT
MANIFOLD NEPTUNE II (INSTRUMENTS) IMPLANT
NS IRRIG 1000ML POUR BTL (IV SOLUTION) ×3 IMPLANT
PACK ORTHO EXTREMITY (CUSTOM PROCEDURE TRAY) ×3 IMPLANT
PAD ARMBOARD 7.5X6 YLW CONV (MISCELLANEOUS) ×6 IMPLANT
PAD CAST 4YDX4 CTTN HI CHSV (CAST SUPPLIES) ×1 IMPLANT
PADDING CAST COTTON 4X4 STRL (CAST SUPPLIES) ×2
PADDING CAST COTTON 6X4 STRL (CAST SUPPLIES) ×3 IMPLANT
PLATE ACE 100DEG 3HOLE (Plate) ×3 IMPLANT
PLATE LOCK COMP 7H FOOT (Plate) ×3 IMPLANT
PLATE LOCKING 2.5 STRAIGHT (Plate) ×3 IMPLANT
RETRIEVER SUT HEWSON (MISCELLANEOUS) ×3 IMPLANT
SCREW CORTICAL 2.7MM  14MM (Screw) ×2 IMPLANT
SCREW CORTICAL 2.7MM 14MM (Screw) ×1 IMPLANT
SCREW CORTICAL 2.7MM 16MM (Screw) ×3 IMPLANT
SCREW CORTICAL 2.7MM 44MM (Screw) ×3 IMPLANT
SCREW CORTICAL 3.5MM  10MM (Screw) ×8 IMPLANT
SCREW CORTICAL 3.5MM  16MM (Screw) ×2 IMPLANT
SCREW CORTICAL 3.5MM  20MM (Screw) ×2 IMPLANT
SCREW CORTICAL 3.5MM 10MM (Screw) ×4 IMPLANT
SCREW CORTICAL 3.5MM 16MM (Screw) ×1 IMPLANT
SCREW CORTICAL 3.5MM 18MM (Screw) ×3 IMPLANT
SCREW CORTICAL 3.5MM 20MM (Screw) ×1 IMPLANT
SCREW CORTICAL 3.5MM 22MM (Screw) ×3 IMPLANT
SCREW CORTICAL 3.5MM 24MM (Screw) ×6 IMPLANT
SCREW LOCK CORT STAR 3.5X16 (Screw) ×6 IMPLANT
SCREW PEG 2.5X20 NONLOCK (Screw) ×3 IMPLANT
SCREW PEG 2.5X22 NONLOCK (Screw) ×3 IMPLANT
SPONGE LAP 18X18 X RAY DECT (DISPOSABLE) ×6 IMPLANT
STAPLER VISISTAT 35W (STAPLE) ×3 IMPLANT
STEM IMPLANT W SCREW (Stem) ×3 IMPLANT
STRIP CLOSURE SKIN 1/2X4 (GAUZE/BANDAGES/DRESSINGS) IMPLANT
SUCTION FRAZIER HANDLE 10FR (MISCELLANEOUS) ×2
SUCTION TUBE FRAZIER 10FR DISP (MISCELLANEOUS) ×1 IMPLANT
SUT ETHILON 2 0 PSLX (SUTURE) ×3 IMPLANT
SUT ETHILON 3 0 PS 1 (SUTURE) ×6 IMPLANT
SUT MNCRL AB 3-0 PS2 18 (SUTURE) ×3 IMPLANT
SUT MON AB 2-0 CT1 36 (SUTURE) ×3 IMPLANT
SUT PDS AB 2-0 CT1 27 (SUTURE) IMPLANT
SUT PROLENE 0 CT (SUTURE) IMPLANT
SUT PROLENE 3 0 PS 2 (SUTURE) ×6 IMPLANT
SUT VIC AB 0 CT1 27 (SUTURE) ×4
SUT VIC AB 0 CT1 27XBRD ANBCTR (SUTURE) ×2 IMPLANT
SUT VIC AB 2-0 CT1 27 (SUTURE) ×4
SUT VIC AB 2-0 CT1 TAPERPNT 27 (SUTURE) ×2 IMPLANT
SUT VIC AB 2-0 CT3 27 (SUTURE) IMPLANT
SYR CONTROL 10ML LL (SYRINGE) ×3 IMPLANT
TOWEL OR 17X24 6PK STRL BLUE (TOWEL DISPOSABLE) ×3 IMPLANT
TOWEL OR 17X26 10 PK STRL BLUE (TOWEL DISPOSABLE) ×6 IMPLANT
TUBE CONNECTING 12'X1/4 (SUCTIONS) ×1
TUBE CONNECTING 12X1/4 (SUCTIONS) ×2 IMPLANT
UNDERPAD 30X30 (UNDERPADS AND DIAPERS) ×3 IMPLANT
WATER STERILE IRR 1000ML POUR (IV SOLUTION) IMPLANT
YANKAUER SUCT BULB TIP NO VENT (SUCTIONS) ×3 IMPLANT

## 2017-11-19 NOTE — Plan of Care (Signed)
  Nutrition: Adequate nutrition will be maintained 11/19/2017 1751 - Progressing by Darrow BussingArcilla, Orean Giarratano M, RN   Elimination: Will not experience complications related to bowel motility 11/19/2017 1751 - Progressing by Darrow BussingArcilla, Seairra Otani M, RN   Pain Managment: General experience of comfort will improve 11/19/2017 1751 - Progressing by Darrow BussingArcilla, Katianna Mcclenney M, RN   Safety: Ability to remain free from injury will improve 11/19/2017 1751 - Progressing by Darrow BussingArcilla, Jaylina Ramdass M, RN

## 2017-11-19 NOTE — Anesthesia Preprocedure Evaluation (Addendum)
Anesthesia Evaluation  Patient identified by MRN, date of birth, ID band Patient awake    Reviewed: Allergy & Precautions, NPO status , Patient's Chart, lab work & pertinent test results  Airway Mallampati: II  TM Distance: >3 FB Neck ROM: Full    Dental  (+) Dental Advisory Given   Pulmonary former smoker,    breath sounds clear to auscultation       Cardiovascular negative cardio ROS   Rhythm:Regular Rate:Normal     Neuro/Psych negative neurological ROS     GI/Hepatic Neg liver ROS, GERD  ,  Endo/Other  negative endocrine ROS  Renal/GU negative Renal ROS     Musculoskeletal   Abdominal   Peds  Hematology negative hematology ROS (+)   Anesthesia Other Findings   Reproductive/Obstetrics                             Lab Results  Component Value Date   WBC 7.7 01/21/2013   HGB 14.4 01/21/2013   HCT 42.5 01/21/2013   MCV 87.6 01/21/2013   PLT 218 01/21/2013   Lab Results  Component Value Date   CREATININE 1.02 09/09/2010   BUN 9 09/09/2010   NA (LL) 09/09/2010    118 RESULT REPEATED AND VERIFIED CRITICAL RESULT CALLED TO, READ BACK BY AND VERIFIED WITH: BINGHAM,S. AT [id number] BY LOVE,T.   K 3.1 (L) 09/09/2010   CL 74 (L) 09/09/2010   CO2 27 09/09/2010    Anesthesia Physical Anesthesia Plan  ASA: II  Anesthesia Plan: General   Post-op Pain Management:    Induction: Intravenous  PONV Risk Score and Plan: 3 and Midazolam, Dexamethasone, Ondansetron and Treatment may vary due to age or medical condition  Airway Management Planned: Oral ETT  Additional Equipment:   Intra-op Plan:   Post-operative Plan: Extubation in OR  Informed Consent: I have reviewed the patients History and Physical, chart, labs and discussed the procedure including the risks, benefits and alternatives for the proposed anesthesia with the patient or authorized representative who has indicated his/her  understanding and acceptance.   Dental advisory given  Plan Discussed with: CRNA  Anesthesia Plan Comments:         Anesthesia Quick Evaluation

## 2017-11-19 NOTE — Anesthesia Procedure Notes (Signed)
Procedure Name: Intubation Date/Time: 11/19/2017 8:39 AM Performed by: Inda Coke, CRNA Pre-anesthesia Checklist: Patient identified, Emergency Drugs available, Suction available and Patient being monitored Patient Re-evaluated:Patient Re-evaluated prior to induction Oxygen Delivery Method: Circle System Utilized Preoxygenation: Pre-oxygenation with 100% oxygen Induction Type: IV induction Ventilation: Mask ventilation without difficulty Laryngoscope Size: Mac and 4 Grade View: Grade I Tube type: Oral Tube size: 7.5 mm Number of attempts: 1 Airway Equipment and Method: Stylet and Oral airway Placement Confirmation: ETT inserted through vocal cords under direct vision,  positive ETCO2 and breath sounds checked- equal and bilateral Secured at: 22 cm Tube secured with: Tape Dental Injury: Teeth and Oropharynx as per pre-operative assessment

## 2017-11-19 NOTE — Transfer of Care (Signed)
Immediate Anesthesia Transfer of Care Note  Patient: Randy Barker  Procedure(s) Performed: OPEN REDUCTION INTERNAL FIXATION (ORIF)BILATERAL ELBOW/OLECRANON FRACTURES (Right Elbow)  Patient Location: PACU  Anesthesia Type:General  Level of Consciousness: awake and alert   Airway & Oxygen Therapy: Patient Spontanous Breathing and Patient connected to nasal cannula oxygen  Post-op Assessment: Report given to RN and Post -op Vital signs reviewed and stable  Post vital signs: Reviewed and stable  Last Vitals:  Vitals:   11/19/17 0553 11/19/17 1300  BP: 131/76 139/80  Pulse: 83 (!) 105  Resp:  12  Temp: 36.8 C (!) 36.4 C  SpO2: 98% 95%    Last Pain:  Vitals:   11/19/17 1300  TempSrc:   PainSc: Asleep         Complications: No apparent anesthesia complications

## 2017-11-19 NOTE — Op Note (Signed)
OrthopaedicSurgeryOperativeNote ([id number]:[id number]) Date of Surgery: 11/19/2017  Admit Date: 11/18/2017   Diagnoses: Pre-Op Diagnoses: Right Monteggia fracture dislocation of elbow   Post-Op Diagnosis: Same  Procedures: 1. CPT 24635-ORIF of right Monteggia fracture 2. CPT [phone] of right coronoid fracture 3. CPT 24366-Radial head replacement 4. CPT 24343-Repair lateral ligament avulsion  Surgeons: Primary: Shona Needles, MD   Location:[facility] OR ROOM 07   AnesthesiaGeneral   Antibiotics:Ancef 2g preop  Tourniquettime: Total Tourniquet Time Documented: Upper Arm (Right) - 88 minutes Total: Upper Arm (Right) - 88 minutes   [id number]:466 mL   Complications:None  Specimens:None  Implants: Implant Name Type Inv. Item Serial No. Manufacturer Lot No. LRB No. Used Action  STEM Rudi Coco - [id number] Stem STEM IMPLANT W SCREW  ZIMMER RECON(ORTH,TRAU,BIO,SG) [id number] Right 1 Implanted  HEAD EXPLOR 12X24MM - [id number] Orthopedic Implant HEAD EXPLOR 12X24MM  ZIMMER RECON(ORTH,TRAU,BIO,SG) [id number] Right 1 Implanted  KIT DISPOSABLE J-KNOT SZ1 - [id number] Anchor KIT DISPOSABLE J-KNOT SZ1  ZIMMER RECON(ORTH,TRAU,BIO,SG) [id number] Right 1 Implanted  KIT DISPOSABLE J-KNOT SZ1 - [id number] Anchor KIT DISPOSABLE J-KNOT SZ1  ZIMMER RECON(ORTH,TRAU,BIO,SG) [id number] Right 1 Implanted  PLATE COMP LK 2.6OM  7 H - [id number] Plate PLATE COMP LK 8.4TX  7 H  ZIMMER RECON(ORTH,TRAU,BIO,SG)  Right 1 Implanted  PLATE ACE 646OEH 3HOLE - [id number] Plate PLATE ACE 037CWU 3HOLE  ZIMMER RECON(ORTH,TRAU,BIO,SG)  Right 1 Implanted  PLATE LOCKING 2.5 STRAIGHT - [id number] Plate PLATE LOCKING 2.5 STRAIGHT  ZIMMER RECON(ORTH,TRAU,BIO,SG)  Right 1 Implanted  SCREW CORTICAL 2.7MM  14MM - [id number] Screw SCREW CORTICAL 2.7MM  14MM  ZIMMER RECON(ORTH,TRAU,BIO,SG)  Right 1 Implanted  SCREW CORTICAL 2.7MM 16MM - [id number] Screw SCREW CORTICAL 2.7MM 16MM  ZIMMER RECON(ORTH,TRAU,BIO,SG)  Right 2 Implanted   SCREW CORTICAL 2.7MM 44MM - [id number] Screw SCREW CORTICAL 2.7MM 44MM  ZIMMER RECON(ORTH,TRAU,BIO,SG)  Right 1 Implanted  SCREW CORTICAL 3.5MM 22MM - [id number] Screw SCREW CORTICAL 3.5MM 22MM  ZIMMER RECON(ORTH,TRAU,BIO,SG)  Right 1 Implanted  SCREW CORTICAL 3.5MM 18MM - [id number] Screw SCREW CORTICAL 3.5MM 18MM  ZIMMER RECON(ORTH,TRAU,BIO,SG)  Right 1 Implanted  SCREW CORTICAL 3.5MM  16MM - [id number] Screw SCREW CORTICAL 3.5MM  16MM  ZIMMER RECON(ORTH,TRAU,BIO,SG)  Right 1 Implanted  SCREW CORTICAL 3.5MM  10MM - [id number] Screw SCREW CORTICAL 3.5MM  10MM  ZIMMER RECON(ORTH,TRAU,BIO,SG)  Right 4 Implanted  SCREW CORTICAL 3.5MM 24MM - [id number] Screw SCREW CORTICAL 3.5MM 24MM  ZIMMER RECON(ORTH,TRAU,BIO,SG)  Right 2 Implanted  SCREW CORT LOCK 3.5MM 16MM - [id number] Screw SCREW CORT LOCK 3.5MM 16MM  ZIMMER RECON(ORTH,TRAU,BIO,SG)  Right 2 Implanted  SCREW 2.5X22MM - [id number] Screw SCREW 2.5X22MM  ZIMMER RECON(ORTH,TRAU,BIO,SG)  Right 1 Implanted  SCREW 2.5X20MM - [id number] Screw SCREW 2.5X20MM  ZIMMER RECON(ORTH,TRAU,BIO,SG)  Right 1 Implanted  SCREW CORTICAL 3.5MM  20MM - [id number] Screw SCREW CORTICAL 3.5MM  20MM  ZIMMER RECON(ORTH,TRAU,BIO,SG)  Right 1 Implanted    IndicationsforSurgery: 52 year old male who was on stilts placing drywall fell and landed on his right and left arm.  Sustained a Monteggia variant fracture dislocation of his right elbow with significant comminution of his radial head was a coronoid fracture and comminuted olecranon fracture with associated dislocation.  He also sustained a comminuted left radial head fracture.  I discussed with him the need for open reduction internal fixation.  I discussed the need for ORIF with radial head replacement and ligament reconstruction. Risks discussed included bleeding requiring blood transfusion, bleeding causing a hematoma, infection, malunion, nonunion, damage to surrounding nerves and blood vessels, pain, hardware prominence or  irritation,  hardware failure, stiffness, post-traumatic arthritis, DVT/PE, compartment syndrome, and even death.. Risks and benefits were extensively discussed as noted above and the patient and their family agreed to proceed with surgery and consent was obtained.  Operative Findings: 1. Comminuted olecranon fracture treated with dual plating with Biomet 4 hole one third tubular plate, 2.7 mm lag screws for proximal fragment fixation and a 7-hole contoured compression plate with 3 screws proximal and distal to the fracture 2.  Repair of coronoid fracture using #2 FiberWire placed through the coronoid fragment and in the anterior capsule and pulled through drill holes through the olecranon. 3.  Arthroplasty of radial head using a ExploR 6 mm stem and a 24 x 12 mm radial head.  Size was determined by reconstruction of the previous radial head as well as fitting to the proximal radial ulnar joint 4.  Lateral ligament reconstruction using juggernaut suture anchors as noted above x 2.   Procedure: The patient was identified in the preoperative holding area. Consent was confirmed with the patient and their family and all questions were answered. The operative extremity was marked after confirmation with the patient. he was then brought back to the operating room by our anesthesia colleagues.  He was placed under general anesthetic and carefully transferred over to a radiolucent flat top table.  A arm board was placed underneath his right upper extremity his splint was cut down.  A nonsterile tourniquet was placed to his upper arm. The operative extremity was then prepped and draped in usual sterile fashion. A preoperative timeout was performed to verify the patient, the procedure, and the extremity. Preoperative antibiotics were dosed.  An Esmarch was used to exsanguinate the extremity and the tourniquet was inflated 250 mmHg for the above-noted time.  I planned an incision out to be able to access both the  olecranon fracture and the radial head and lateral ligament reconstruction.  I incised through the skin and subcutaneous tissue.  Here there is a significant ren in the fascia of the anconeus dorsal to it and I attempted to extend this fascial rent across the anconeus into a  interval between the anconeus and ECU muscle belly.  After I had entered this interval I encountered the significant amount of radial head comminution.  There is multiple fragments and I felt that it was unreconstructable.  The lateral ligament complex was also avulsed off the distal humerus and appeared that the ulnar portion was avulsed off as well.  The elbow was significantly unstable with the distal humerus subluxing and dislocating of the trochlea multiple times during this exposure.  The coronoid fragment was able to be visualized through the radial head defect.  I first turned my attention to the olecranon fracture to be able to get length.  There was a cortical fragment that I was able to access from all the severe soft tissue stripping that occurred from the injury.  I clamped this in place and placed 2.7 mm lag screws from dorsal to volar.  I then attempted to reduce the transverse fracture.  I placed a drill hole in the distal segment to try to clamp this in place.  However the rotation of the distal segment was too difficult to overcome.  I attempted to place a unicortical 2.5 mm plate but unfortunately the force was also too powerful for this plate to provisionally hold it.  I attempted to contour a compression plate, 7-hole.  Unfortunately I was unable to fully reduce and adequately reduce the fracture.  There was a small amount of comminution that occurred while placing the screws for this plate.  I then reduced the fracture once more held this in place and then provisionally held this with a 4 hole one third tubular plate with unicortical nonlocking screws.  This was able to hold the reduction while I contoured and placed  the 7 hole compression plate on the lateral aspect of the olecranon.  Now that I had the length of the olecranon was fixed I turned my attention to the coronoid fragment.  The fragment was very thin and I felt that a another incision to plate the fragment would cause a significant amount of further damage to the elbow and soft tissue stripping.  I felt that I would be able to establish stability using FiberWire suture through drill holes in the olecranon.  I passed #2 FiberWire suture through the fragment and through the anterior capsule and hold these through drill holes made in the olecranon.  I reduced the fragment with a reduction tenaculum and tied the FiberWire suture over the dorsal aspect of the olecranon.  This was able to reduce the fragment and the capsule to a near anatomic position.     I then attempted to reconstruct the radial head on the back table to size the implant.  I felt that a 24 mm radial head implant was most appropriate.  I then sequentially broached the canal of the radius after a saw was used to make the proximal portion flush.  I sized up to a 6 mm stem.  I placed this as a trial and then placed a 12 mm x 28m radial head.  This had good fit and was not overstuffing the radiocapitellar joint.  I confirmed this using fluoroscopy.  I placed my final implant with the above-noted sizes.  I then obtained a fluoroscopic images which showed stability of the ulnohumeral joint.  However with any pronation the radial head would dislocate.  This was due to the lateral ligament avulsion from both the proximal and distal portions of the ligament.  I predrilled and placed Juggernaut suture anchors as noted above.  I then reconstructed the lateral ulnar collateral ligament and was able to tie this down in association with the fascial interval between the anconeus and ECU.  Once this repair was obtained the elbow was much more stable.  I then used fluoroscopy in the lateral view to test the motion  and the stability.  From nearly full extension to 130 degrees of flexion the elbow remained stable.  The radial head did not dislocate with pronation.  At this point I felt that I had provided a good amount of stability to the elbow.   Final fluoroscopic images were obtained.  The wound was copiously irrigated with normal saline.  A gram of vancomycin powder and 1.2 g of tobramycin powder was placed in the wound.  A layered closure of 0 Vicryl for the fascia, 2-0 Vicryl and 3-0 nylon for the skin was used.  A sterile dressing consisting of bacitracin ointment, Adaptic, 4 x 4's and sterile cast padding.  A well-padded long-arm splint was then placed.  The patient was awoken from anesthesia and taken to PACU in stable condition.  Post Op Plan/Instructions: Patient will be nonweightbearing to the right upper extremity.  Will return tomorrow for ORIF versus radial head replacement left side.  He will receive Lovenox for DVT prophylaxis while he is in the hospital.  He will receive postoperative antibiotics.  I was present and performed the entire surgery.  Katha Hamming, MD [facility]

## 2017-11-19 NOTE — Interval H&P Note (Signed)
History and Physical Interval Note:  11/19/2017 8:05 AM  Randy RamalFidel Rader  has presented today for surgery, with the diagnosis of BILATERAL ELBOW FRACTURES  The various methods of treatment have been discussed with the patient and family. After consideration of risks, benefits and other options for treatment, the patient has consented to  Procedure(s): OPEN REDUCTION INTERNAL FIXATION (ORIF)BILATERAL ELBOW/OLECRANON FRACTURES (Bilateral) as a surgical intervention .  The patient's history has been reviewed, patient examined, no change in status, stable for surgery.  I have reviewed the patient's chart and labs.  Questions were answered to the patient's satisfaction.     [name]

## 2017-11-19 NOTE — Anesthesia Postprocedure Evaluation (Signed)
Anesthesia Post Note  Patient: Randy RamalFidel Barker  Procedure(s) Performed: OPEN REDUCTION INTERNAL FIXATION (ORIF)BILATERAL ELBOW/OLECRANON FRACTURES (Right Elbow)     Patient location during evaluation: PACU Anesthesia Type: General Level of consciousness: awake and alert Pain management: pain level controlled Vital Signs Assessment: post-procedure vital signs reviewed and stable Respiratory status: spontaneous breathing, nonlabored ventilation, respiratory function stable and patient connected to nasal cannula oxygen Cardiovascular status: blood pressure returned to baseline and stable Postop Assessment: no apparent nausea or vomiting Anesthetic complications: no    Last Vitals:  Vitals:   11/19/17 1415 11/19/17 1501  BP: (!) 171/101 (!) 195/89  Pulse: 91 91  Resp: 16 16  Temp: (!) 36.4 C 36.4 C  SpO2: 95% 98%    Last Pain:  Vitals:   11/19/17 1501  TempSrc:   PainSc: 6                  Petina Muraski,JAMES TERRILL

## 2017-11-20 ENCOUNTER — Inpatient Hospital Stay (HOSPITAL_COMMUNITY): Payer: Medicaid Other

## 2017-11-20 ENCOUNTER — Encounter (HOSPITAL_COMMUNITY): Payer: Self-pay | Admitting: Certified Registered Nurse Anesthetist

## 2017-11-20 ENCOUNTER — Inpatient Hospital Stay (HOSPITAL_COMMUNITY): Payer: Medicaid Other | Admitting: Certified Registered Nurse Anesthetist

## 2017-11-20 ENCOUNTER — Encounter (HOSPITAL_COMMUNITY)
Admission: EM | Disposition: A | Discharge status: Home / Self Care | Payer: Self-pay | Source: Home / Self Care | Attending: Student

## 2017-11-20 HISTORY — PX: RADIAL HEAD ARTHROPLASTY: SHX6044

## 2017-11-20 LAB — CBC
HCT: 31.3 % — ABNORMAL LOW (ref 39.0–52.0)
Hemoglobin: 10.2 g/dL — ABNORMAL LOW (ref 13.0–17.0)
MCH: 30 pg (ref 26.0–34.0)
MCHC: 32.6 g/dL (ref 30.0–36.0)
MCV: 92.1 fL (ref 78.0–100.0)
PLATELETS: 177 10*3/uL (ref 150–400)
RBC: 3.4 MIL/uL — ABNORMAL LOW (ref 4.22–5.81)
RDW: 14 % (ref 11.5–15.5)
WBC: 10.2 10*3/uL (ref 4.0–10.5)

## 2017-11-20 SURGERY — ARTHROPLASTY, RADIUS, HEAD
Anesthesia: General | Site: Elbow | Laterality: Left

## 2017-11-20 MED ORDER — PHENYLEPHRINE 40 MCG/ML (10ML) SYRINGE FOR IV PUSH (FOR BLOOD PRESSURE SUPPORT)
PREFILLED_SYRINGE | INTRAVENOUS | Status: DC | PRN
  Administered 2017-11-20: 80 ug via INTRAVENOUS
  Administered 2017-11-20: 40 ug via INTRAVENOUS

## 2017-11-20 MED ORDER — DEXAMETHASONE SODIUM PHOSPHATE 10 MG/ML IJ SOLN
INTRAMUSCULAR | Status: DC | PRN
  Administered 2017-11-20: 10 mg via INTRAVENOUS

## 2017-11-20 MED ORDER — OXYCODONE HCL 5 MG PO TABS
ORAL_TABLET | ORAL | Status: AC
  Administered 2017-11-20: 5 mg via ORAL
  Filled 2017-11-20: qty 1

## 2017-11-20 MED ORDER — PROPOFOL 10 MG/ML IV BOLUS
INTRAVENOUS | Status: DC | PRN
  Administered 2017-11-20: 140 mg via INTRAVENOUS

## 2017-11-20 MED ORDER — 0.9 % SODIUM CHLORIDE (POUR BTL) OPTIME
TOPICAL | Status: DC | PRN
  Administered 2017-11-20: 1000 mL

## 2017-11-20 MED ORDER — MIDAZOLAM HCL 5 MG/5ML IJ SOLN
INTRAMUSCULAR | Status: DC | PRN
  Administered 2017-11-20: 2 mg via INTRAVENOUS

## 2017-11-20 MED ORDER — OXYCODONE HCL 5 MG PO TABS
5.0000 mg | ORAL_TABLET | Freq: Once | ORAL | Status: AC | PRN
  Administered 2017-11-20: 5 mg via ORAL

## 2017-11-20 MED ORDER — PROMETHAZINE HCL 25 MG/ML IJ SOLN: 6.2500 mg | INTRAMUSCULAR | Status: DC | PRN

## 2017-11-20 MED ORDER — FENTANYL CITRATE (PF) 250 MCG/5ML IJ SOLN
INTRAMUSCULAR | Status: AC
  Filled 2017-11-20: qty 5

## 2017-11-20 MED ORDER — LIDOCAINE HCL (CARDIAC) 20 MG/ML IV SOLN
INTRAVENOUS | Status: DC | PRN
  Administered 2017-11-20: 80 mg via INTRAVENOUS

## 2017-11-20 MED ORDER — PROPOFOL 10 MG/ML IV BOLUS
INTRAVENOUS | Status: AC
  Filled 2017-11-20: qty 40

## 2017-11-20 MED ORDER — HYDROMORPHONE HCL 1 MG/ML IJ SOLN
0.2500 mg | INTRAMUSCULAR | Status: DC | PRN
  Administered 2017-11-20 (×2): 0.5 mg via INTRAVENOUS

## 2017-11-20 MED ORDER — FENTANYL CITRATE (PF) 100 MCG/2ML IJ SOLN
INTRAMUSCULAR | Status: DC | PRN
  Administered 2017-11-20 (×5): 50 ug via INTRAVENOUS

## 2017-11-20 MED ORDER — VANCOMYCIN HCL 1000 MG IV SOLR
INTRAVENOUS | Status: AC
  Filled 2017-11-20: qty 1000

## 2017-11-20 MED ORDER — MIDAZOLAM HCL 2 MG/2ML IJ SOLN
INTRAMUSCULAR | Status: AC
  Filled 2017-11-20: qty 2

## 2017-11-20 MED ORDER — OXYCODONE HCL 5 MG/5ML PO SOLN: 5.0000 mg | Freq: Once | ORAL | Status: AC | PRN

## 2017-11-20 MED ORDER — HYDROMORPHONE HCL 1 MG/ML IJ SOLN
INTRAMUSCULAR | Status: AC
  Administered 2017-11-20: 0.5 mg via INTRAVENOUS
  Filled 2017-11-20: qty 1

## 2017-11-20 MED ORDER — VANCOMYCIN HCL 1000 MG IV SOLR
INTRAVENOUS | Status: DC | PRN
  Administered 2017-11-20: 1000 mg

## 2017-11-20 MED ORDER — SUGAMMADEX SODIUM 200 MG/2ML IV SOLN
INTRAVENOUS | Status: DC | PRN
  Administered 2017-11-20: 200 mg via INTRAVENOUS

## 2017-11-20 MED ORDER — ONDANSETRON HCL 4 MG/2ML IJ SOLN
INTRAMUSCULAR | Status: DC | PRN
  Administered 2017-11-20: 4 mg via INTRAVENOUS

## 2017-11-20 MED ORDER — KETOROLAC TROMETHAMINE 15 MG/ML IJ SOLN
15.0000 mg | Freq: Four times a day (QID) | INTRAMUSCULAR | Status: DC
  Administered 2017-11-20 – 2017-11-21 (×4): 15 mg via INTRAVENOUS
  Filled 2017-11-20 (×4): qty 1

## 2017-11-20 MED ORDER — LACTATED RINGERS IV SOLN
INTRAVENOUS | Status: DC | PRN
  Administered 2017-11-20: 07:00:00 via INTRAVENOUS

## 2017-11-20 MED ORDER — MEPERIDINE HCL 50 MG/ML IJ SOLN: 6.2500 mg | INTRAMUSCULAR | Status: DC | PRN

## 2017-11-20 MED ORDER — ROCURONIUM BROMIDE 100 MG/10ML IV SOLN
INTRAVENOUS | Status: DC | PRN
  Administered 2017-11-20: 10 mg via INTRAVENOUS
  Administered 2017-11-20: 50 mg via INTRAVENOUS
  Administered 2017-11-20: 10 mg via INTRAVENOUS

## 2017-11-20 SURGICAL SUPPLY — 73 items
BANDAGE ACE 3X5.8 VEL STRL LF (GAUZE/BANDAGES/DRESSINGS) IMPLANT
BANDAGE ACE 4X5 VEL STRL LF (GAUZE/BANDAGES/DRESSINGS) ×3 IMPLANT
BANDAGE ACE 6X5 VEL STRL LF (GAUZE/BANDAGES/DRESSINGS) ×3 IMPLANT
BENZOIN TINCTURE PRP APPL 2/3 (GAUZE/BANDAGES/DRESSINGS) IMPLANT
BLADE AVERAGE 25MMX9MM (BLADE) ×1
BLADE AVERAGE 25X9 (BLADE) ×2 IMPLANT
BLADE CLIPPER SURG (BLADE) ×3 IMPLANT
BLADE SURG 10 STRL SS (BLADE) ×3 IMPLANT
BNDG COHESIVE 4X5 TAN STRL (GAUZE/BANDAGES/DRESSINGS) IMPLANT
BNDG COHESIVE 6X5 TAN STRL LF (GAUZE/BANDAGES/DRESSINGS) ×3 IMPLANT
BNDG ESMARK 4X9 LF (GAUZE/BANDAGES/DRESSINGS) ×3 IMPLANT
BNDG GAUZE ELAST 4 BULKY (GAUZE/BANDAGES/DRESSINGS) IMPLANT
BRUSH SCRUB SURG 4.25 DISP (MISCELLANEOUS) ×6 IMPLANT
CHLORAPREP W/TINT 26ML (MISCELLANEOUS) ×3 IMPLANT
CLEANER TIP ELECTROSURG 2X2 (MISCELLANEOUS) ×3 IMPLANT
CLOSURE WOUND 1/2 X4 (GAUZE/BANDAGES/DRESSINGS)
COVER SURGICAL LIGHT HANDLE (MISCELLANEOUS) ×6 IMPLANT
CUFF TOURNIQUET SINGLE 18IN (TOURNIQUET CUFF) IMPLANT
CUFF TOURNIQUET SINGLE 24IN (TOURNIQUET CUFF) ×3 IMPLANT
DECANTER SPIKE VIAL GLASS SM (MISCELLANEOUS) IMPLANT
DERMABOND ADVANCED (GAUZE/BANDAGES/DRESSINGS) ×2
DERMABOND ADVANCED .7 DNX12 (GAUZE/BANDAGES/DRESSINGS) ×1 IMPLANT
DRAPE C-ARM 42X72 X-RAY (DRAPES) ×3 IMPLANT
DRAPE C-ARMOR (DRAPES) ×3 IMPLANT
DRAPE INCISE IOBAN 66X45 STRL (DRAPES) IMPLANT
DRAPE U-SHAPE 47X51 STRL (DRAPES) ×3 IMPLANT
DRSG ADAPTIC 3X8 NADH LF (GAUZE/BANDAGES/DRESSINGS) IMPLANT
DRSG EMULSION OIL 3X3 NADH (GAUZE/BANDAGES/DRESSINGS) IMPLANT
DRSG MEPILEX BORDER 4X8 (GAUZE/BANDAGES/DRESSINGS) ×3 IMPLANT
ELECT REM PT RETURN 9FT ADLT (ELECTROSURGICAL) ×3
ELECTRODE REM PT RTRN 9FT ADLT (ELECTROSURGICAL) ×1 IMPLANT
FACESHIELD WRAPAROUND (MASK) IMPLANT
GAUZE SPONGE 4X4 12PLY STRL (GAUZE/BANDAGES/DRESSINGS) IMPLANT
GAUZE XEROFORM 1X8 LF (GAUZE/BANDAGES/DRESSINGS) IMPLANT
GAUZE XEROFORM 5X9 LF (GAUZE/BANDAGES/DRESSINGS) IMPLANT
GLOVE BIO SURGEON STRL SZ7.5 (GLOVE) ×12 IMPLANT
GLOVE BIOGEL PI IND STRL 7.5 (GLOVE) ×1 IMPLANT
GLOVE BIOGEL PI INDICATOR 7.5 (GLOVE) ×2
GOWN STRL REUS W/ TWL LRG LVL3 (GOWN DISPOSABLE) ×2 IMPLANT
GOWN STRL REUS W/TWL LRG LVL3 (GOWN DISPOSABLE) ×4
HEAD EXPLOR 12X24MM (Orthopedic Implant) ×3 IMPLANT
KIT BASIN OR (CUSTOM PROCEDURE TRAY) ×3 IMPLANT
KIT ROOM TURNOVER OR (KITS) ×3 IMPLANT
MANIFOLD NEPTUNE II (INSTRUMENTS) ×3 IMPLANT
NS IRRIG 1000ML POUR BTL (IV SOLUTION) ×3 IMPLANT
PACK ORTHO EXTREMITY (CUSTOM PROCEDURE TRAY) ×3 IMPLANT
PAD ARMBOARD 7.5X6 YLW CONV (MISCELLANEOUS) ×6 IMPLANT
PAD CAST 4YDX4 CTTN HI CHSV (CAST SUPPLIES) IMPLANT
PADDING CAST COTTON 4X4 STRL (CAST SUPPLIES)
SPONGE LAP 18X18 X RAY DECT (DISPOSABLE) ×6 IMPLANT
STAPLER VISISTAT 35W (STAPLE) ×3 IMPLANT
STEM IMPLANT W SCREW (Stem) ×3 IMPLANT
STRIP CLOSURE SKIN 1/2X4 (GAUZE/BANDAGES/DRESSINGS) IMPLANT
SUCTION FRAZIER HANDLE 10FR (MISCELLANEOUS) ×2
SUCTION TUBE FRAZIER 10FR DISP (MISCELLANEOUS) ×1 IMPLANT
SUT MNCRL AB 3-0 PS2 18 (SUTURE) ×3 IMPLANT
SUT MON AB 2-0 CT1 36 (SUTURE) ×3 IMPLANT
SUT PDS AB 2-0 CT1 27 (SUTURE) IMPLANT
SUT PROLENE 0 CT (SUTURE) IMPLANT
SUT PROLENE 3 0 PS 2 (SUTURE) ×6 IMPLANT
SUT VIC AB 0 CT1 27 (SUTURE) ×4
SUT VIC AB 0 CT1 27XBRD ANBCTR (SUTURE) ×2 IMPLANT
SUT VIC AB 2-0 CT1 27 (SUTURE) ×4
SUT VIC AB 2-0 CT1 TAPERPNT 27 (SUTURE) ×2 IMPLANT
SUT VIC AB 2-0 CT3 27 (SUTURE) IMPLANT
SYR CONTROL 10ML LL (SYRINGE) ×3 IMPLANT
TOWEL OR 17X24 6PK STRL BLUE (TOWEL DISPOSABLE) IMPLANT
TOWEL OR 17X26 10 PK STRL BLUE (TOWEL DISPOSABLE) ×6 IMPLANT
TUBE CONNECTING 12'X1/4 (SUCTIONS) ×1
TUBE CONNECTING 12X1/4 (SUCTIONS) ×2 IMPLANT
UNDERPAD 30X30 (UNDERPADS AND DIAPERS) ×3 IMPLANT
WATER STERILE IRR 1000ML POUR (IV SOLUTION) ×3 IMPLANT
YANKAUER SUCT BULB TIP NO VENT (SUCTIONS) ×3 IMPLANT

## 2017-11-20 NOTE — Progress Notes (Signed)
OT Cancellation Note  Patient Details Name: Randy RamalFidel Barker MRN: [id number] DOB: 1966-06-23   Cancelled Treatment:    Reason Eval/Treat Not Completed: Patient at procedure or test/ unavailable  Gaye AlkenBailey A Kaevion Sinclair M.S., OTR/L Pager: [phone]  11/20/2017, 7:01 AM

## 2017-11-20 NOTE — Anesthesia Procedure Notes (Signed)
Procedure Name: Intubation Date/Time: 11/20/2017 7:43 AM Performed by: Candis Shine, CRNA Pre-anesthesia Checklist: Patient identified, Emergency Drugs available, Suction available and Patient being monitored Patient Re-evaluated:Patient Re-evaluated prior to induction Oxygen Delivery Method: Circle System Utilized Preoxygenation: Pre-oxygenation with 100% oxygen Induction Type: IV induction Ventilation: Mask ventilation without difficulty Laryngoscope Size: Mac and 3 Grade View: Grade I Tube type: Oral Tube size: 7.5 mm Number of attempts: 1 Airway Equipment and Method: Stylet Placement Confirmation: ETT inserted through vocal cords under direct vision,  positive ETCO2 and breath sounds checked- equal and bilateral Secured at: 23 cm Tube secured with: Tape Dental Injury: Teeth and Oropharynx as per pre-operative assessment

## 2017-11-20 NOTE — Plan of Care (Signed)
  Activity: Risk for activity intolerance will decrease 11/20/2017 1442 - Progressing by Darrow BussingArcilla, Jcion Buddenhagen M, RN   Nutrition: Adequate nutrition will be maintained 11/20/2017 1442 - Progressing by Darrow BussingArcilla, Ewel Lona M, RN   Coping: Level of anxiety will decrease 11/20/2017 1442 - Progressing by Darrow BussingArcilla, Noah Pelaez M, RN   Elimination: Will not experience complications related to bowel motility 11/20/2017 1442 - Progressing by Darrow BussingArcilla, Gurney Balthazor M, RN   Pain Managment: General experience of comfort will improve 11/20/2017 1442 - Progressing by Darrow BussingArcilla, Tanda Morrissey M, RN   Safety: Ability to remain free from injury will improve 11/20/2017 1442 - Progressing by Darrow BussingArcilla, Francine Hannan M, RN

## 2017-11-20 NOTE — Anesthesia Postprocedure Evaluation (Signed)
Anesthesia Post Note  Patient: Randy Barker  Procedure(s) Performed: ORIF VS RADIAL HEAD ARTHROPLASTY (Left Elbow)     Patient location during evaluation: PACU Anesthesia Type: General Level of consciousness: awake and alert Pain management: pain level controlled Vital Signs Assessment: post-procedure vital signs reviewed and stable Respiratory status: spontaneous breathing, nonlabored ventilation and respiratory function stable Cardiovascular status: blood pressure returned to baseline and stable Postop Assessment: no apparent nausea or vomiting Anesthetic complications: no    Last Vitals:  Vitals:   11/20/17 1105 11/20/17 1122  BP: (!) 143/86 (!) 155/85  Pulse: 100 88  Resp: (!) 21 18  Temp: 37.2 C 37.2 C  SpO2: 94% 97%    Last Pain:  Vitals:   11/20/17 1105  TempSrc:   PainSc: Asleep                 Lowella CurbWarren Ray Lynden Carrithers

## 2017-11-20 NOTE — Op Note (Signed)
OrthopaedicSurgeryOperativeNote (ZOX:[id number]:[id number]) Date of Surgery: 11/20/2017  Admit Date: 11/18/2017   Diagnoses: Pre-Op Diagnoses: Marlene BastMason III radial head fracture  Post-Op Diagnosis: Same  Procedures: 1. CPT 24366-Radial head arthroplasty  Surgeons: Primary: Roby LoftsHaddix, Kevin P, MD   Location:[facility] OR ROOM 09   AnesthesiaGeneral   Antibiotics:Ancef 2g preop   Tourniquettime: Total Tourniquet Time Documented: Upper Arm (Left) - 79 minutes Total: Upper Arm (Left) - 79 minutes  EstimatedBloodLoss:20 mL   Complications:None  Specimens: None  Implants: Implant Name Type Inv. Item Serial No. Manufacturer Lot No. LRB No. Used Action  STEM Mellody DrownMPLANT W SCREW - [id number] Stem STEM IMPLANT W SCREW  ZIMMER RECON(ORTH,TRAU,BIO,SG) [id number] Left 1 Implanted  HEAD EXPLOR 12X24MM - [id number] Orthopedic Implant HEAD EXPLOR 12X24MM  ZIMMER RECON(ORTH,TRAU,BIO,SG) [id number] Left 1 Implanted    IndicationsforSurgery: 52 year old male who was on stilts placing drywall fell and landed on his right and left arm.  Sustained a Monteggia variant fracture dislocation of his right elbow with significant comminution of his radial head was a coronoid fracture and comminuted olecranon fracture with associated dislocation.  He also sustained a comminuted left radial head fracture.  I discussed with him the need for ORIF vs radial head replacement for the left elbow. Risks discussed included bleeding requiring blood transfusion, bleeding causing a hematoma, infection, malunion, nonunion, damage to surrounding nerves and blood vessels, pain, hardware prominence or irritation, hardware failure, stiffness, post-traumatic arthritis, DVT/PE, compartment syndrome, and even death.. Risks and benefits were extensively discussed as noted above and the patient and their family agreed to proceed with surgery and consent was obtained.  Operative Findings: Comminuted radial head fracture with 3+ fragments, replacement  with Zimmer Biomet ExploR radial head 6mm stem and 12x4124mm radial head.  Procedure: The patient was identified in the preoperative holding area. Consent was confirmed with the patient and their family and all questions were answered. The operative extremity was marked after confirmation with the patient. They were then brought back to the operating room by our anesthesia colleagues. He was carefully transferred to a radiolucent flat top table and was placed under general anesthesia. A hand table was positioned and the arm was placed on the hand table. A tourniquet was placed to the upper arm. The operative extremity was then prepped and draped in usual sterile fashion. A preoperative timeout was performed to verify the patient, the procedure, and the extremity. Preoperative antibiotics were dosed.  Fluoroscopic images were obtained to confirm the comminuted nature of the radial head.  An Esmarch was then used to exsanguinate the extremity and the tourniquet was inflated to 250 mmHg.  The total tourniquet time was noted above.  A lateral incision was then made through skin and subcutaneous tissue.  The Kaplan interval between ECU and brachioradialis was identified.  I tried to use this interval to prevent disruption of the lateral ligament complex as there was no ligament disruption from the injury.  I incised through the annular ligament and capsule and here I encountered the comminuted radial head fracture. I removed the fragments and placed these on the back table to be able to size the radial head arthroplasty.  A saw was then used to resect the proximal portion of the radial neck.  I then broached the medullary canal of the radial neck and increased sizes of the broaches until I felt that I had appropriate fit of a stem.  I chose to place and trial a 6 mm stem.  Using the radial head fragments I chose a 24  mm radial head and trialed this.  I used a number of markers to make sure that I did not overstuff  the radiocapitellar joint.  The ulnar humeral joint on the AP was symmetric across the medial and lateral sides of the joint.  The radial head lined up with the proximal radial ulnar joint.  And there was no impingement of the capitellum through normal elbow range of motion.  I then removed the trial implant and then placed the final implant including the set screw.  I obtained final fluoroscopic images.  I thoroughly irrigated the incision.  I placed 1 g of vancomycin powder.  I closed the capsulotomy with 0 Vicryl.  I closed the fascial incision was 3-0 Vicryl as well.  The skin was closed with 2-0 Vicryl and 3-0 Monocryl with Dermabond.  Sterile dressing was placed.  The patient was awoken from anesthesia and taken to PACU in stable condition.  Post Op Plan/Instructions: The patient will be nonweightbearing to the operative extremity.  They will receive postoperative antibiotics.  DVT prophylaxis will be aspirin upon discharge.  I was present and performed the entire surgery.  Truitt Merle, MD [facility]

## 2017-11-20 NOTE — Progress Notes (Signed)
Patient personal cell phone found in bed after coming into OR. Patient phone place in back with patient label placed on the bag. Was placed in patient chart to go with patient to PACU by Betsey AmenAmy Anup Brigham RN. Will make PACU nurse aware.

## 2017-11-20 NOTE — Transfer of Care (Signed)
Immediate Anesthesia Transfer of Care Note  Patient: [name]  Procedure(s) Performed: ORIF VS RADIAL HEAD ARTHROPLASTY (Left Elbow)  Patient Location: PACU  Anesthesia Type:General  Level of Consciousness: awake, alert  and oriented  Airway & Oxygen Therapy: Patient Spontanous Breathing and Patient connected to nasal cannula oxygen  Post-op Assessment: Report given to RN and Post -op Vital signs reviewed and stable  Post vital signs: Reviewed and stable  Last Vitals:  Vitals:   11/20/17 0053 11/20/17 0414  BP: (!) 189/87 (!) 188/79  Pulse: 74 72  Resp: 16 16  Temp: 37.1 C 37.1 C  SpO2: 99% 100%    Last Pain:  Vitals:   11/20/17 0414  TempSrc: Oral  PainSc:          Complications: No apparent anesthesia complications

## 2017-11-20 NOTE — Anesthesia Preprocedure Evaluation (Signed)
Anesthesia Evaluation  Patient identified by MRN, date of birth, ID band Patient awake    Reviewed: Allergy & Precautions, NPO status , Patient's Chart, lab work & pertinent test results  Airway Mallampati: II  TM Distance: >3 FB Neck ROM: Full    Dental  (+) Dental Advisory Given   Pulmonary former smoker,    breath sounds clear to auscultation       Cardiovascular negative cardio ROS   Rhythm:Regular Rate:Normal     Neuro/Psych negative neurological ROS     GI/Hepatic Neg liver ROS, GERD  ,  Endo/Other  negative endocrine ROS  Renal/GU negative Renal ROS     Musculoskeletal   Abdominal   Peds  Hematology negative hematology ROS (+)   Anesthesia Other Findings   Reproductive/Obstetrics                             Lab Results  Component Value Date   WBC 10.2 11/20/2017   HGB 10.2 (L) 11/20/2017   HCT 31.3 (L) 11/20/2017   MCV 92.1 11/20/2017   PLT 177 11/20/2017   Lab Results  Component Value Date   CREATININE 1.02 09/09/2010   BUN 9 09/09/2010   NA 141 11/19/2017   K 4.0 11/19/2017   CL 74 (L) 09/09/2010   CO2 27 09/09/2010    Anesthesia Physical  Anesthesia Plan  ASA: II  Anesthesia Plan: General   Post-op Pain Management:    Induction: Intravenous  PONV Risk Score and Plan: 2 and Midazolam, Ondansetron and Treatment may vary due to age or medical condition  Airway Management Planned: Oral ETT  Additional Equipment:   Intra-op Plan:   Post-operative Plan: Extubation in OR  Informed Consent: I have reviewed the patients History and Physical, chart, labs and discussed the procedure including the risks, benefits and alternatives for the proposed anesthesia with the patient or authorized representative who has indicated his/her understanding and acceptance.   Dental advisory given  Plan Discussed with: CRNA  Anesthesia Plan Comments:          Anesthesia Quick Evaluation

## 2017-11-21 ENCOUNTER — Encounter (HOSPITAL_COMMUNITY): Payer: Self-pay | Admitting: Student

## 2017-11-21 DIAGNOSIS — S52271A Monteggia's fracture of right ulna, initial encounter for closed fracture: Secondary | ICD-10-CM

## 2017-11-21 DIAGNOSIS — S52041A Displaced fracture of coronoid process of right ulna, initial encounter for closed fracture: Secondary | ICD-10-CM

## 2017-11-21 DIAGNOSIS — S53104A Unspecified dislocation of right ulnohumeral joint, initial encounter: Secondary | ICD-10-CM

## 2017-11-21 MED ORDER — ASPIRIN EC 325 MG PO TBEC: 325.0000 mg | DELAYED_RELEASE_TABLET | Freq: Every day | ORAL | 0 refills | Status: AC

## 2017-11-21 MED ORDER — OXYCODONE-ACETAMINOPHEN 5-325 MG PO TABS: 1.0000 | ORAL_TABLET | ORAL | 0 refills | Status: DC | PRN

## 2017-11-21 MED ORDER — METHOCARBAMOL 750 MG PO TABS: 750.0000 mg | ORAL_TABLET | Freq: Four times a day (QID) | ORAL | 0 refills | Status: DC | PRN

## 2017-11-21 NOTE — Discharge Summary (Signed)
Orthopaedic Trauma Service (OTS)  Patient ID: Randy RamalFidel Barker MRN: [id number] DOB/AGE: 04-01-1966 52 y.o.  Admit date: 11/18/2017 Discharge date: 11/21/2017  Admission Diagnoses:Fracture of radial head, left, closed   Monteggia's fracture of right ulna   Elbow dislocation, right, initial encounter   Closed fracture of coronoid process of right ulna  Discharge Diagnoses:  Active Problems:   Fracture of radial head, left, closed   Monteggia's fracture of right ulna   Elbow dislocation, right, initial encounter   Closed fracture of coronoid process of right ulna   Past Medical History:  Diagnosis Date  . GERD (gastroesophageal reflux disease)    occ  . WUJWJXBJ(478.2Headache(784.0)      Procedures Performed: 11/19/2017: 1. CPT 24635-ORIF of right Monteggia fracture 2. CPT [phone]-ORIF of right coronoid fracture 3. CPT 24366-Radial head replacement 4. CPT 24343-Repair lateral ligament avulsion  11/20/2017: 1. CPT 24366-Radial head arthroplasty left elbow  Discharged Condition: good  Hospital Course: The patient was admitted after a closed reduction was performed in the emergency room.  I took him the following day for the above-noted procedure on his right elbow.  He did well following this procedure and I took him subsequently for the left elbow on the 14th.  He did well following the surgery as well.  He was seen postoperative day 2 from his initial right elbow surgery.  The dressing was changed the splint was taken off and he was placed in a hinged elbow brace.  He is able to tolerate motion relatively well.  His pain was well controlled with oral medications.  He was voiding spontaneously.  He was cleared by occupational therapy.  He was discharged home on March 15.  Consults: None  Significant Diagnostic Studies: None  Treatments: surgery: as above  Discharge Exam:   Disposition: 01-Home or Self Care   Allergies as of 11/21/2017   No Known Allergies     Medication List    STOP  taking these medications   Pseudoephedrine-Ibuprofen 30-200 MG Tabs     TAKE these medications   aspirin EC 325 MG tablet Take 1 tablet (325 mg total) by mouth daily.   methocarbamol 750 MG tablet Commonly known as:  ROBAXIN-750 Take 1 tablet (750 mg total) by mouth every 6 (six) hours as needed for muscle spasms.   oxyCODONE-acetaminophen 5-325 MG tablet Commonly known as:  PERCOCET Take 1-2 tablets by mouth every 4 (four) hours as needed for severe pain.      Follow-up Information    Ovadia Lopp, Gillie MannersKevin P, MD Follow up on 12/02/2017.   Specialty:  Orthopedic Surgery Why:  At 1:30 PM Contact information: [address] [place] [address] [phone]           Discharge Instructions and Plan: The patient will be patient will be discharged home.  He will be nonweightbearing bilateral upper extremities.  Range of motion as tolerated.  He will need to stay in his hinged elbow brace on the right side.  I will see him back in 1-2 weeks for x-rays and suture removal.  Signed:  Roby LoftsKevin P. Aunisty Reali, MD [facility] [phone] (phone) 11/21/2017, 3:00 PM

## 2017-11-21 NOTE — Evaluation (Signed)
Occupational Therapy Evaluation Patient Details Name: Randy Barker MRN: [id number] DOB: 13-Jun-1966 Today's Date: 11/21/2017    History of Present Illness Pt. is a 52 y.o. male who recently underwent bilateral elbow/olecranon ORIF procedures after a fall at work. Pt. chief compaint is post-operative pain and headache. No pertinent PMHx in chart.   Clinical Impression   Pt. reports he was indepedent in ADL PTA. Currently, pt. requires max assist with dressing, bathing, and total assist for feeding and grooming due to ROM limitations and pain. Pt. was educated on compensatory strategies for UB dressing, hand and shoulder exercises to reduce edema, and was able to maintain sitting EOB with min guard. Pt. engaged in hall mobility with min guard for safety. Upon d/c pt. intends to return home alone with family assistance for ADL PRN. Recommend 24/7 supervision for safety and assistance with ADL tasks. Will continue to follow acutely to improve abililty to engage in ADLs.    Follow Up Recommendations  Follow surgeon's recommendation for DC plan and follow-up therapies;Supervision/Assistance - 24 hour(HH aide)    Equipment Recommendations  None recommended by OT    Recommendations for Other Services       Precautions / Restrictions Precautions Precautions: None Restrictions Weight Bearing Restrictions: Yes RUE Weight Bearing: Non weight bearing LUE Weight Bearing: Non weight bearing      Mobility Bed Mobility               General bed mobility comments: Pt. was sitting EOB upon arrival.  Transfers Overall transfer level: Needs assistance Equipment used: None Transfers: Sit to/from Stand Sit to Stand: Min guard         General transfer comment: for safety    Balance Overall balance assessment: Needs assistance Sitting-balance support: No upper extremity supported;Feet supported Sitting balance-Leahy Scale: Good Sitting balance - Comments: Maintained sitting EOB without  bilateral UE supported   Standing balance support: No upper extremity supported Standing balance-Leahy Scale: Fair                             ADL either performed or assessed with clinical judgement   ADL Overall ADL's : Needs assistance/impaired Eating/Feeding: Total assistance;Sitting Eating/Feeding Details (indicate cue type and reason): Reports he niece is planning to come help with meals Grooming: Total assistance;Sitting   Upper Body Bathing: Maximal assistance;Cueing for safety;Sitting   Lower Body Bathing: Total assistance;Cueing for safety;Sit to/from stand   Upper Body Dressing : Maximal assistance;Cueing for safety;Sitting   Lower Body Dressing: Total assistance;Cueing for safety;Sit to/from stand   Toilet Transfer: Min guard;Cueing for safety;Ambulation Toilet Transfer Details (indicate cue type and reason): Simulated during sit-stand transfer. Toileting- Clothing Manipulation and Hygiene: Total assistance;Cueing for safety;Sit to/from stand       Functional mobility during ADLs: Min guard;Cueing for safety General ADL Comments: Pt. reports his niece will be available to help with ADL PRN. Pt. was educated on compensatory strategies for UB dressing and precautions.     Vision         Perception     Praxis      Pertinent Vitals/Pain Pain Assessment: Faces Faces Pain Scale: Hurts even more Pain Location: bilateral elbows and head Pain Descriptors / Indicators: Aching;Discomfort;Headache;Operative site guarding;Sore Pain Intervention(s): Limited activity within patient's tolerance;Monitored during session;Premedicated before session     Hand Dominance Right   Extremity/Trunk Assessment Upper Extremity Assessment Upper Extremity Assessment: LUE deficits/detail;RUE deficits/detail RUE Deficits / Details: Pt. reports more  pain in R UE. Very limited shoulder flexion. Increased edema in hand resulting in limited digit flexion. RUE: Unable to fully  assess due to pain;Unable to fully assess due to immobilization RUE Coordination: decreased gross motor;decreased fine motor LUE Deficits / Details: Full shoulder AROM and digit AROM LUE: Unable to fully assess due to pain;Unable to fully assess due to immobilization   Lower Extremity Assessment Lower Extremity Assessment: Overall WFL for tasks assessed   Cervical / Trunk Assessment Cervical / Trunk Assessment: Normal   Communication Communication Communication: Prefers language other than [place];Interpreter utilized(David; Runner, broadcasting/film/video # [phone])   Cognition Arousal/Alertness: Awake/alert Behavior During Therapy: WFL for tasks assessed/performed Overall Cognitive Status: Within Functional Limits for tasks assessed                                     General Comments       Exercises     Shoulder Instructions      Home Living Family/patient expects to be discharged to:: Private residence Living Arrangements: Alone Available Help at Discharge: Available PRN/intermittently;Family Type of Home: Apartment Home Access: Stairs to enter [employer] of Steps: 3-4   Home Layout: One level     Bathroom Shower/Tub: Producer, television/film/video: Standard     Home Equipment: Shower seat - built in   Additional Comments: Pt. reports niece will be available PRN.      Prior Functioning/Environment Level of Independence: Independent        Comments: Working in Garment/textile technologist Problem List: Decreased range of motion;Impaired balance (sitting and/or standing);Decreased knowledge of use of DME or AE;Decreased knowledge of precautions;Pain;Impaired UE functional use;Increased edema      OT Treatment/Interventions: Self-care/ADL training;Therapeutic exercise;DME and/or AE instruction;Therapeutic activities;Patient/family education;Balance training    OT Goals(Current goals can be found in the care plan section) Acute Rehab OT  Goals Patient Stated Goal: regain independence OT Goal Formulation: With patient Time For Goal Achievement: 12/05/17 Potential to Achieve Goals: Good ADL Goals Pt Will Perform Eating: with min assist;with adaptive utensils;sitting Pt Will Perform Upper Body Bathing: with mod assist;sitting;with adaptive equipment Pt Will Perform Lower Body Bathing: with mod assist;sit to/from stand;with adaptive equipment Additional ADL Goal #1: Pt will perform bed mobility with supervision. Additional ADL Goal #2: Pt will perform bil digit flex/ext and bil shoulder flex/ext as able for edema control.  OT Frequency: Min 2X/week   Barriers to D/C: Decreased caregiver support  pt lives alone       Co-evaluation              AM-PAC PT "6 Clicks" Daily Activity     Outcome Measure Help from another person eating meals?: Total Help from another person taking care of personal grooming?: Total Help from another person toileting, which includes using toliet, bedpan, or urinal?: A Lot Help from another person bathing (including washing, rinsing, drying)?: Total Help from another person to put on and taking off regular upper body clothing?: A Lot Help from another person to put on and taking off regular lower body clothing?: Total 6 Click Score: 8   End of Session Nurse Communication: Mobility status  Activity Tolerance: Patient tolerated treatment well Patient left: in bed;with call bell/phone within reach;with nursing/sitter in room  OT Visit Diagnosis: Unsteadiness on feet (R26.81);Pain Pain - Right/Left: (both) Pain - part of body: Arm(head)  Time: [id number] OT Time Calculation (min): 23 min Charges:  OT General Charges $OT Visit: 1 Visit OT Evaluation $OT Eval Moderate Complexity: 1 Mod OT Treatments $Self Care/Home Management : 8-22 mins G-Codes:     Samirah Scarpati A. Brett Albinooffey, M.S., OTR/L Pager: [phone]  Gaye AlkenBailey A Nichele Slawson 11/21/2017, 10:58 AM

## 2017-11-21 NOTE — Progress Notes (Signed)
Orthopedic Tech Progress Note Patient Details:  Randy Barker Vinzant [birth year] [id number]  Patient ID: Randy Barker Doerr, male   DOB: [birth year], 52 y.o.   MRN: [id number]   Saul FordyceJennifer C Wymon Swaney 11/21/2017, 9:07 AMCalled [employer] for right hinged elbow brace.

## 2017-11-21 NOTE — Discharge Instructions (Signed)
Orthopaedic Trauma Service Discharge Instructions   General Discharge Instructions  WEIGHT BEARING STATUS: Nonweight bearing both arms  RANGE OF MOTION/ACTIVITY: No restrictions for left arm, use as tolerated, for right arm stay in hinged elbow brace at all times other than showering  Wound Care: See below. You may remove the dressing on the left arm on Sunday 3/17  DVT/PE prophylaxis: A daily aspirin for 6 weeks  Diet: as you were eating previously.  Can use over the counter stool softeners and bowel preparations, such as Miralax, to help with bowel movements.  Narcotics can be constipating.  Be sure to drink plenty of fluids  PAIN MEDICATION USE AND EXPECTATIONS  You have likely been given narcotic medications to help control your pain.  After a traumatic event that results in an fracture (broken bone) with or without surgery, it is ok to use narcotic pain medications to help control one's pain.  We understand that everyone responds to pain differently and each individual patient will be evaluated on a regular basis for the continued need for narcotic medications. Ideally, narcotic medication use should last no more than 6-8 weeks (coinciding with fracture healing).   As a patient it is your responsibility as well to monitor narcotic medication use and report the amount and frequency you use these medications when you come to your office visit.   We would also advise that if you are using narcotic medications, you should take a dose prior to therapy to maximize you participation.  IF YOU ARE ON NARCOTIC MEDICATIONS IT IS NOT PERMISSIBLE TO OPERATE A MOTOR VEHICLE (MOTORCYCLE/CAR/TRUCK/MOPED) OR HEAVY MACHINERY DO NOT MIX NARCOTICS WITH OTHER CNS (CENTRAL NERVOUS SYSTEM) DEPRESSANTS SUCH AS ALCOHOL   STOP SMOKING OR USING NICOTINE PRODUCTS!!!!  As discussed nicotine severely impairs your body's ability to heal surgical and traumatic wounds but also impairs bone healing.  Wounds and bone  heal by forming microscopic blood vessels (angiogenesis) and nicotine is a vasoconstrictor (essentially, shrinks blood vessels).  Therefore, if vasoconstriction occurs to these microscopic blood vessels they essentially disappear and are unable to deliver necessary nutrients to the healing tissue.  This is one modifiable factor that you can do to dramatically increase your chances of healing your injury.    (This means no smoking, no nicotine gum, patches, etc)  DO NOT USE NONSTEROIDAL ANTI-INFLAMMATORY DRUGS (NSAID'S)  Using products such as Advil (ibuprofen), Aleve (naproxen), Motrin (ibuprofen) for additional pain control during fracture healing can delay and/or prevent the healing response.  If you would like to take over the counter (OTC) medication, Tylenol (acetaminophen) is ok.  However, some narcotic medications that are given for pain control contain acetaminophen as well. Therefore, you should not exceed more than 4000 mg of tylenol in a day if you do not have liver disease.  Also note that there are may OTC medicines, such as cold medicines and allergy medicines that my contain tylenol as well.  If you have any questions about medications and/or interactions please ask your doctor/PA or your pharmacist.      ICE AND ELEVATE INJURED/OPERATIVE EXTREMITY  Using ice and elevating the injured extremity above your heart can help with swelling and pain control.  Icing in a pulsatile fashion, such as 20 minutes on and 20 minutes off, can be followed.    Do not place ice directly on skin. Make sure there is a barrier between to skin and the ice pack.    Using frozen items such as frozen peas works well  as the conform nicely to the are that needs to be iced.  USE AN ACE WRAP OR TED HOSE FOR SWELLING CONTROL  In addition to icing and elevation, Ace wraps or TED hose are used to help limit and resolve swelling.  It is recommended to use Ace wraps or TED hose until you are informed to stop.    When  using Ace Wraps start the wrapping distally (farthest away from the body) and wrap proximally (closer to the body)   Example: If you had surgery on your leg or thing and you do not have a splint on, start the ace wrap at the toes and work your way up to the thigh        If you had surgery on your upper extremity and do not have a splint on, start the ace wrap at your fingers and work your way up to the upper arm  IF YOU ARE IN A SPLINT OR CAST DO NOT REMOVE IT FOR ANY REASON   If your splint gets wet for any reason please contact the office immediately. You may shower in your splint or cast as long as you keep it dry.  This can be done by wrapping in a cast cover or garbage back (or similar)  Do Not stick any thing down your splint or cast such as pencils, money, or hangers to try and scratch yourself with.  If you feel itchy take benadryl as prescribed on the bottle for itching   CALL THE OFFICE WITH ANY QUESTIONS OR CONCERNS: [phone]    Discharge Wound Care Instructions  Do NOT apply any ointments, solutions or lotions to pin sites or surgical wounds.  These prevent needed drainage and even though solutions like hydrogen peroxide kill bacteria, they also damage cells lining the pin sites that help fight infection.  Applying lotions or ointments can keep the wounds moist and can cause them to breakdown and open up as well. This can increase the risk for infection. When in doubt call the office.  Surgical incisions should be dressed daily.  If any drainage is noted, use one layer of adaptic, then gauze, Kerlix, and an ace wrap.  Once the incision is completely dry and without drainage, it may be left open to air out.  Showering may begin 36-48 hours later.  Cleaning gently with soap and water.  Traumatic wounds should be dressed daily as well.    One layer of adaptic, gauze, Kerlix, then ace wrap.  The adaptic can be discontinued once the draining has ceased    If you have a wet to dry  dressing: wet the gauze with saline the squeeze as much saline out so the gauze is moist (not soaking wet), place moistened gauze over wound, then place a dry gauze over the moist one, followed by Kerlix wrap, then ace wrap.

## 2017-11-21 NOTE — Progress Notes (Signed)
Called the [facility] in regards to the right hinged elbow brace, they gave me the phone number to the [facility] [phone]). I called them and they said a practitioner will be available in about 30 minutes and it will be delivered between 2-3pm. This is the only thing holding up patient's discharge.

## 2017-12-09 ENCOUNTER — Ambulatory Visit: Payer: Self-pay | Admitting: Student

## 2017-12-09 ENCOUNTER — Encounter (HOSPITAL_COMMUNITY): Payer: Self-pay | Admitting: Anesthesiology

## 2017-12-09 DIAGNOSIS — S53004A Unspecified dislocation of right radial head, initial encounter: Secondary | ICD-10-CM | POA: Insufficient documentation

## 2017-12-09 NOTE — Progress Notes (Signed)
Pt was a late add on for surgery tomorrow. Attempted several times to call pt via [employer] and pt did not answer and there is no voicemail available.

## 2017-12-09 NOTE — Anesthesia Preprocedure Evaluation (Addendum)
Anesthesia Evaluation  Patient identified by MRN, date of birth, ID band Patient awake    Reviewed: Allergy & Precautions, NPO status , Patient's Chart, lab work & pertinent test results  Airway Mallampati: II  TM Distance: >3 FB Neck ROM: Full    Dental no notable dental hx. (+) Teeth Intact, Dental Advisory Given   Pulmonary former smoker,    Pulmonary exam normal breath sounds clear to auscultation       Cardiovascular negative cardio ROS Normal cardiovascular exam Rhythm:Regular Rate:Normal     Neuro/Psych  Headaches, negative psych ROS   GI/Hepatic Neg liver ROS, GERD  Medicated and Controlled,  Endo/Other  negative endocrine ROS  Renal/GU negative Renal ROS  negative genitourinary   Musculoskeletal Right elbow dislocation   Abdominal   Peds  Hematology negative hematology ROS (+)   Anesthesia Other Findings   Reproductive/Obstetrics                            Anesthesia Physical Anesthesia Plan  ASA: II  Anesthesia Plan: General   Post-op Pain Management:    Induction:   PONV Risk Score and Plan: 3 and Midazolam, Dexamethasone and Ondansetron  Airway Management Planned: Oral ETT  Additional Equipment:   Intra-op Plan:   Post-operative Plan: Extubation in OR  Informed Consent: I have reviewed the patients History and Physical, chart, labs and discussed the procedure including the risks, benefits and alternatives for the proposed anesthesia with the patient or authorized representative who has indicated his/her understanding and acceptance.   Dental advisory given  Plan Discussed with: CRNA, Anesthesiologist and Surgeon  Anesthesia Plan Comments:       Anesthesia Quick Evaluation

## 2017-12-10 ENCOUNTER — Ambulatory Visit (HOSPITAL_COMMUNITY): Payer: Self-pay | Admitting: Anesthesiology

## 2017-12-10 ENCOUNTER — Encounter (HOSPITAL_COMMUNITY)
Admission: RE | Disposition: A | Discharge status: Home / Self Care | Payer: Self-pay | Source: Ambulatory Visit | Attending: Student

## 2017-12-10 ENCOUNTER — Encounter (HOSPITAL_COMMUNITY): Payer: Self-pay | Admitting: *Deleted

## 2017-12-10 ENCOUNTER — Ambulatory Visit (HOSPITAL_COMMUNITY): Payer: Self-pay

## 2017-12-10 ENCOUNTER — Ambulatory Visit (HOSPITAL_COMMUNITY)
Admission: RE | Admit: 2017-12-10 | Discharge: 2017-12-10 | Disposition: A | Discharge status: Home / Self Care | Payer: Self-pay | Source: Ambulatory Visit | Attending: Student | Admitting: Student

## 2017-12-10 DIAGNOSIS — Y990 Civilian activity done for income or pay: Secondary | ICD-10-CM | POA: Insufficient documentation

## 2017-12-10 DIAGNOSIS — Z419 Encounter for procedure for purposes other than remedying health state, unspecified: Secondary | ICD-10-CM

## 2017-12-10 DIAGNOSIS — S53105A Unspecified dislocation of left ulnohumeral joint, initial encounter: Secondary | ICD-10-CM | POA: Insufficient documentation

## 2017-12-10 DIAGNOSIS — K219 Gastro-esophageal reflux disease without esophagitis: Secondary | ICD-10-CM | POA: Insufficient documentation

## 2017-12-10 DIAGNOSIS — S42409A Unspecified fracture of lower end of unspecified humerus, initial encounter for closed fracture: Secondary | ICD-10-CM

## 2017-12-10 DIAGNOSIS — S53004A Unspecified dislocation of right radial head, initial encounter: Secondary | ICD-10-CM

## 2017-12-10 DIAGNOSIS — S52001A Unspecified fracture of upper end of right ulna, initial encounter for closed fracture: Secondary | ICD-10-CM | POA: Insufficient documentation

## 2017-12-10 DIAGNOSIS — Z87891 Personal history of nicotine dependence: Secondary | ICD-10-CM | POA: Insufficient documentation

## 2017-12-10 DIAGNOSIS — W1789XA Other fall from one level to another, initial encounter: Secondary | ICD-10-CM | POA: Insufficient documentation

## 2017-12-10 HISTORY — PX: CLOSED REDUCTION ELBOW FRACTURE: SHX930

## 2017-12-10 LAB — CBC
HCT: 34.8 % — ABNORMAL LOW (ref 39.0–52.0)
HEMOGLOBIN: 11.2 g/dL — AB (ref 13.0–17.0)
MCH: 29.5 pg (ref 26.0–34.0)
MCHC: 32.2 g/dL (ref 30.0–36.0)
MCV: 91.6 fL (ref 78.0–100.0)
Platelets: 382 10*3/uL (ref 150–400)
RBC: 3.8 MIL/uL — ABNORMAL LOW (ref 4.22–5.81)
RDW: 14.3 % (ref 11.5–15.5)
WBC: 7.1 10*3/uL (ref 4.0–10.5)

## 2017-12-10 LAB — BASIC METABOLIC PANEL
Anion gap: 11 (ref 5–15)
BUN: 10 mg/dL (ref 6–20)
CALCIUM: 9 mg/dL (ref 8.9–10.3)
CHLORIDE: 105 mmol/L (ref 101–111)
CO2: 22 mmol/L (ref 22–32)
CREATININE: 0.74 mg/dL (ref 0.61–1.24)
GFR calc non Af Amer: >60 mL/min (ref >60)
GLUCOSE: 96 mg/dL (ref 65–99)
Potassium: 3.9 mmol/L (ref 3.5–5.1)
Sodium: 138 mmol/L (ref 135–145)

## 2017-12-10 SURGERY — CLOSED REDUCTION, ELBOW
Anesthesia: General | Site: Elbow | Laterality: Right

## 2017-12-10 MED ORDER — HYDROMORPHONE HCL 1 MG/ML IJ SOLN
INTRAMUSCULAR | Status: DC | PRN
  Administered 2017-12-10 (×2): 0.5 mg via INTRAVENOUS

## 2017-12-10 MED ORDER — PROPOFOL 10 MG/ML IV BOLUS
INTRAVENOUS | Status: AC
  Filled 2017-12-10: qty 20

## 2017-12-10 MED ORDER — FENTANYL CITRATE (PF) 100 MCG/2ML IJ SOLN
INTRAMUSCULAR | Status: DC | PRN
  Administered 2017-12-10: 100 ug via INTRAVENOUS
  Administered 2017-12-10 (×3): 50 ug via INTRAVENOUS
  Administered 2017-12-10: 100 ug via INTRAVENOUS
  Administered 2017-12-10 (×3): 50 ug via INTRAVENOUS

## 2017-12-10 MED ORDER — MIDAZOLAM HCL 2 MG/2ML IJ SOLN
INTRAMUSCULAR | Status: AC
  Filled 2017-12-10: qty 2

## 2017-12-10 MED ORDER — OXYCODONE-ACETAMINOPHEN 5-325 MG PO TABS: 1.0000 | ORAL_TABLET | ORAL | 0 refills | Status: DC | PRN

## 2017-12-10 MED ORDER — BACITRACIN ZINC 500 UNIT/GM EX OINT
TOPICAL_OINTMENT | CUTANEOUS | Status: AC
  Filled 2017-12-10: qty 28.35

## 2017-12-10 MED ORDER — HYDROMORPHONE HCL 1 MG/ML IJ SOLN
INTRAMUSCULAR | Status: AC
  Administered 2017-12-10: 0.5 mg via INTRAVENOUS
  Filled 2017-12-10: qty 1

## 2017-12-10 MED ORDER — EPHEDRINE SULFATE 50 MG/ML IJ SOLN
INTRAMUSCULAR | Status: DC | PRN
  Administered 2017-12-10: 10 mg via INTRAVENOUS
  Administered 2017-12-10: 5 mg via INTRAVENOUS

## 2017-12-10 MED ORDER — DEXAMETHASONE SODIUM PHOSPHATE 10 MG/ML IJ SOLN
INTRAMUSCULAR | Status: DC | PRN
  Administered 2017-12-10: 10 mg via INTRAVENOUS

## 2017-12-10 MED ORDER — HYDROMORPHONE HCL 1 MG/ML IJ SOLN
0.2500 mg | INTRAMUSCULAR | Status: DC | PRN
  Administered 2017-12-10 (×2): 0.5 mg via INTRAVENOUS

## 2017-12-10 MED ORDER — LACTATED RINGERS IV SOLN
INTRAVENOUS | Status: DC | PRN
  Administered 2017-12-10 (×2): via INTRAVENOUS

## 2017-12-10 MED ORDER — TOBRAMYCIN SULFATE 1.2 G IJ SOLR
INTRAMUSCULAR | Status: DC | PRN
  Administered 2017-12-10: 1.2 g via TOPICAL

## 2017-12-10 MED ORDER — ROCURONIUM BROMIDE 100 MG/10ML IV SOLN
INTRAVENOUS | Status: DC | PRN
  Administered 2017-12-10 (×2): 10 mg via INTRAVENOUS
  Administered 2017-12-10: 20 mg via INTRAVENOUS
  Administered 2017-12-10: 50 mg via INTRAVENOUS
  Administered 2017-12-10: 10 mg via INTRAVENOUS

## 2017-12-10 MED ORDER — ROCURONIUM BROMIDE 10 MG/ML (PF) SYRINGE
PREFILLED_SYRINGE | INTRAVENOUS | Status: AC
  Filled 2017-12-10: qty 5

## 2017-12-10 MED ORDER — DEXAMETHASONE SODIUM PHOSPHATE 10 MG/ML IJ SOLN
INTRAMUSCULAR | Status: AC
  Filled 2017-12-10: qty 1

## 2017-12-10 MED ORDER — HYDROMORPHONE HCL 1 MG/ML IJ SOLN
INTRAMUSCULAR | Status: AC
  Filled 2017-12-10: qty 0.5

## 2017-12-10 MED ORDER — TOBRAMYCIN SULFATE 1.2 G IJ SOLR
INTRAMUSCULAR | Status: AC
  Filled 2017-12-10: qty 1.2

## 2017-12-10 MED ORDER — 0.9 % SODIUM CHLORIDE (POUR BTL) OPTIME
TOPICAL | Status: DC | PRN
  Administered 2017-12-10: 1000 mL

## 2017-12-10 MED ORDER — PROPOFOL 10 MG/ML IV BOLUS
INTRAVENOUS | Status: DC | PRN
  Administered 2017-12-10: 150 mg via INTRAVENOUS
  Administered 2017-12-10: 50 mg via INTRAVENOUS

## 2017-12-10 MED ORDER — PHENYLEPHRINE 40 MCG/ML (10ML) SYRINGE FOR IV PUSH (FOR BLOOD PRESSURE SUPPORT)
PREFILLED_SYRINGE | INTRAVENOUS | Status: AC
  Filled 2017-12-10: qty 10

## 2017-12-10 MED ORDER — SUGAMMADEX SODIUM 200 MG/2ML IV SOLN
INTRAVENOUS | Status: DC | PRN
  Administered 2017-12-10: 150 mg via INTRAVENOUS

## 2017-12-10 MED ORDER — SUGAMMADEX SODIUM 200 MG/2ML IV SOLN
INTRAVENOUS | Status: AC
  Filled 2017-12-10: qty 2

## 2017-12-10 MED ORDER — VANCOMYCIN HCL 1000 MG IV SOLR
INTRAVENOUS | Status: DC | PRN
  Administered 2017-12-10: 1000 mg via TOPICAL

## 2017-12-10 MED ORDER — MEPERIDINE HCL 50 MG/ML IJ SOLN: 6.2500 mg | INTRAMUSCULAR | Status: DC | PRN

## 2017-12-10 MED ORDER — VANCOMYCIN HCL 1000 MG IV SOLR
INTRAVENOUS | Status: AC
  Filled 2017-12-10: qty 1000

## 2017-12-10 MED ORDER — POVIDONE-IODINE 10 % EX SWAB: 2.0000 "application " | Freq: Once | CUTANEOUS | Status: DC

## 2017-12-10 MED ORDER — FENTANYL CITRATE (PF) 250 MCG/5ML IJ SOLN
INTRAMUSCULAR | Status: AC
  Filled 2017-12-10: qty 5

## 2017-12-10 MED ORDER — HYDROCODONE-ACETAMINOPHEN 7.5-325 MG PO TABS
1.0000 | ORAL_TABLET | Freq: Once | ORAL | Status: AC | PRN
  Administered 2017-12-10: 1 via ORAL

## 2017-12-10 MED ORDER — CEFAZOLIN SODIUM-DEXTROSE 2-4 GM/100ML-% IV SOLN
2.0000 g | INTRAVENOUS | Status: AC
  Administered 2017-12-10: 2 g via INTRAVENOUS
  Filled 2017-12-10: qty 100

## 2017-12-10 MED ORDER — EPHEDRINE 5 MG/ML INJ
INTRAVENOUS | Status: AC
  Filled 2017-12-10: qty 10

## 2017-12-10 MED ORDER — SUCCINYLCHOLINE CHLORIDE 200 MG/10ML IV SOSY
PREFILLED_SYRINGE | INTRAVENOUS | Status: AC
  Filled 2017-12-10: qty 10

## 2017-12-10 MED ORDER — LIDOCAINE HCL (CARDIAC) 20 MG/ML IV SOLN
INTRAVENOUS | Status: DC | PRN
  Administered 2017-12-10: 40 mg via INTRAVENOUS

## 2017-12-10 MED ORDER — PHENYLEPHRINE HCL 10 MG/ML IJ SOLN
INTRAMUSCULAR | Status: DC | PRN
  Administered 2017-12-10 (×4): 80 ug via INTRAVENOUS

## 2017-12-10 MED ORDER — MIDAZOLAM HCL 5 MG/5ML IJ SOLN
INTRAMUSCULAR | Status: DC | PRN
  Administered 2017-12-10: 2 mg via INTRAVENOUS

## 2017-12-10 MED ORDER — ONDANSETRON HCL 4 MG/2ML IJ SOLN
INTRAMUSCULAR | Status: DC | PRN
  Administered 2017-12-10: 4 mg via INTRAVENOUS

## 2017-12-10 MED ORDER — HYDROCODONE-ACETAMINOPHEN 7.5-325 MG PO TABS
ORAL_TABLET | ORAL | Status: AC
  Administered 2017-12-10: 1 via ORAL
  Filled 2017-12-10: qty 1

## 2017-12-10 MED ORDER — CHLORHEXIDINE GLUCONATE 4 % EX LIQD: 60.0000 mL | Freq: Once | CUTANEOUS | Status: DC

## 2017-12-10 MED ORDER — ONDANSETRON HCL 4 MG/2ML IJ SOLN
INTRAMUSCULAR | Status: AC
  Filled 2017-12-10: qty 2

## 2017-12-10 MED ORDER — PROMETHAZINE HCL 25 MG/ML IJ SOLN: 6.2500 mg | INTRAMUSCULAR | Status: DC | PRN

## 2017-12-10 SURGICAL SUPPLY — 81 items
ANCHOR JUGGERKNOT WTAP NDL 2.9 (Anchor) ×3 IMPLANT
BANDAGE ACE 3X5.8 VEL STRL LF (GAUZE/BANDAGES/DRESSINGS) ×3 IMPLANT
BANDAGE ACE 4X5 VEL STRL LF (GAUZE/BANDAGES/DRESSINGS) ×3 IMPLANT
BIT DRILL 2.5X2.75 QC CALB (BIT) ×3 IMPLANT
BIT DRILL CALIBRATED 2.7 (BIT) ×2 IMPLANT
BIT DRILL CALIBRATED 2.7MM (BIT) ×1
BIT DRILL JUGRKNT W/NDL BIT2.9 (DRILL) ×1 IMPLANT
BLADE AVERAGE 25MMX9MM (BLADE) ×1
BLADE AVERAGE 25X9 (BLADE) ×2 IMPLANT
BLADE SURG 10 STRL SS (BLADE) IMPLANT
BNDG ESMARK 4X9 LF (GAUZE/BANDAGES/DRESSINGS) ×3 IMPLANT
BRUSH SCRUB SURG 4.25 DISP (MISCELLANEOUS) ×6 IMPLANT
CHLORAPREP W/TINT 26ML (MISCELLANEOUS) ×3 IMPLANT
CLEANER TIP ELECTROSURG 2X2 (MISCELLANEOUS) ×3 IMPLANT
COVER SURGICAL LIGHT HANDLE (MISCELLANEOUS) ×6 IMPLANT
CUFF TOURNIQUET SINGLE 18IN (TOURNIQUET CUFF) ×3 IMPLANT
DECANTER SPIKE VIAL GLASS SM (MISCELLANEOUS) IMPLANT
DRAPE C-ARM 42X72 X-RAY (DRAPES) ×3 IMPLANT
DRAPE C-ARMOR (DRAPES) IMPLANT
DRAPE INCISE IOBAN 66X45 STRL (DRAPES) IMPLANT
DRAPE U-SHAPE 47X51 STRL (DRAPES) ×3 IMPLANT
DRILL JUGGERKNOT W/NDL BIT 2.9 (DRILL) ×3
DRSG ADAPTIC 3X8 NADH LF (GAUZE/BANDAGES/DRESSINGS) ×3 IMPLANT
ELECT REM PT RETURN 9FT ADLT (ELECTROSURGICAL) ×3
ELECTRODE REM PT RTRN 9FT ADLT (ELECTROSURGICAL) ×1 IMPLANT
GAUZE SPONGE 4X4 12PLY STRL (GAUZE/BANDAGES/DRESSINGS) ×3 IMPLANT
GLOVE BIO SURGEON STRL SZ7.5 (GLOVE) ×12 IMPLANT
GLOVE BIOGEL PI IND STRL 7.5 (GLOVE) ×1 IMPLANT
GLOVE BIOGEL PI INDICATOR 7.5 (GLOVE) ×2
GOWN STRL REUS W/ TWL LRG LVL3 (GOWN DISPOSABLE) ×2 IMPLANT
GOWN STRL REUS W/TWL LRG LVL3 (GOWN DISPOSABLE) ×4
HEAD RADIAL 12X22 (Head) ×3 IMPLANT
K-WIRE FIXATION 2.0X6 (WIRE) ×6
KIT BASIN OR (CUSTOM PROCEDURE TRAY) ×3 IMPLANT
KIT TURNOVER KIT B (KITS) ×3 IMPLANT
KWIRE FIXATION 2.0X6 (WIRE) ×2 IMPLANT
NS IRRIG 1000ML POUR BTL (IV SOLUTION) ×3 IMPLANT
PACK ORTHO EXTREMITY (CUSTOM PROCEDURE TRAY) ×3 IMPLANT
PAD ARMBOARD 7.5X6 YLW CONV (MISCELLANEOUS) ×6 IMPLANT
PAD CAST 4YDX4 CTTN HI CHSV (CAST SUPPLIES) ×1 IMPLANT
PADDING CAST COTTON 4X4 STRL (CAST SUPPLIES) ×2
PLATE FIBULAR COMP LOCK 10H (Plate) ×3 IMPLANT
PLATE OLECRANON LONG (Plate) ×3 IMPLANT
PUTTY DBM STAGRAFT PLUS 5CC (Putty) ×3 IMPLANT
SCREW CORT 3.5X26 (Screw) ×2 IMPLANT
SCREW CORT T15 26X3.5XST LCK (Screw) ×1 IMPLANT
SCREW CORT T15 28X3.5XST LCK (Screw) ×1 IMPLANT
SCREW CORTICAL 3.5X28MM (Screw) ×2 IMPLANT
SCREW CORTICAL LOW PROF 3.5X20 (Screw) ×12 IMPLANT
SCREW LOCK CORT STAR 3.5X10 (Screw) ×3 IMPLANT
SCREW LOCK CORT STAR 3.5X18 (Screw) ×3 IMPLANT
SCREW LOCK CORT STAR 3.5X22 (Screw) ×3 IMPLANT
SCREW LOCK CORT STAR 3.5X32 (Screw) ×3 IMPLANT
SCREW LOW PROFILE 18MMX3.5MM (Screw) ×9 IMPLANT
SCREW NON LOCKING LP 3.5 16MM (Screw) ×3 IMPLANT
SPLINT PLASTER CAST XFAST 4X15 (CAST SUPPLIES) ×1 IMPLANT
SPLINT PLASTER XTRA FAST SET 4 (CAST SUPPLIES) ×2
SPONGE LAP 18X18 X RAY DECT (DISPOSABLE) ×6 IMPLANT
STAPLER VISISTAT 35W (STAPLE) ×3 IMPLANT
SUCTION FRAZIER HANDLE 10FR (MISCELLANEOUS) ×2
SUCTION TUBE FRAZIER 10FR DISP (MISCELLANEOUS) ×1 IMPLANT
SUT ETHILON 3 0 PS 1 (SUTURE) ×9 IMPLANT
SUT MNCRL AB 3-0 PS2 18 (SUTURE) ×3 IMPLANT
SUT MON AB 2-0 CT1 36 (SUTURE) ×3 IMPLANT
SUT PDS AB 2-0 CT1 27 (SUTURE) IMPLANT
SUT PROLENE 0 CT (SUTURE) IMPLANT
SUT PROLENE 3 0 PS 2 (SUTURE) ×6 IMPLANT
SUT VIC AB 0 CT1 27 (SUTURE) ×4
SUT VIC AB 0 CT1 27XBRD ANBCTR (SUTURE) ×2 IMPLANT
SUT VIC AB 2-0 CT1 27 (SUTURE) ×6
SUT VIC AB 2-0 CT1 TAPERPNT 27 (SUTURE) ×3 IMPLANT
SUT VIC AB 2-0 CT3 27 (SUTURE) IMPLANT
SYR CONTROL 10ML LL (SYRINGE) ×3 IMPLANT
TOWEL OR 17X24 6PK STRL BLUE (TOWEL DISPOSABLE) IMPLANT
TOWEL OR 17X26 10 PK STRL BLUE (TOWEL DISPOSABLE) ×6 IMPLANT
TUBE CONNECTING 12'X1/4 (SUCTIONS) ×1
TUBE CONNECTING 12X1/4 (SUCTIONS) ×2 IMPLANT
UNDERPAD 30X30 (UNDERPADS AND DIAPERS) ×3 IMPLANT
WASHER 3.5MM (Orthopedic Implant) ×9 IMPLANT
WATER STERILE IRR 1000ML POUR (IV SOLUTION) ×3 IMPLANT
YANKAUER SUCT BULB TIP NO VENT (SUCTIONS) ×3 IMPLANT

## 2017-12-10 NOTE — Progress Notes (Signed)
Orthopedic Tech Progress Note Patient Details:  Randy Barker Soja 02/03/1966 [id number]  Patient ID: Randy Barker Ericson, male   DOB: 02/03/1966, 52 y.o.   MRN: [id number]   Nikki DomCrawford, Kinzy Weyers 12/10/2017, 2:52 PM Viewed order from doctor's order list

## 2017-12-10 NOTE — Anesthesia Postprocedure Evaluation (Signed)
Anesthesia Post Note  Patient: Randy Barker  Procedure(s) Performed: Revision Open Reduction Internal Fixation of Right Elbow (Right Elbow)     Patient location during evaluation: PACU Anesthesia Type: General Level of consciousness: awake and alert and oriented Pain management: pain level controlled Vital Signs Assessment: post-procedure vital signs reviewed and stable Respiratory status: spontaneous breathing, nonlabored ventilation and respiratory function stable Cardiovascular status: blood pressure returned to baseline and stable Postop Assessment: no apparent nausea or vomiting Anesthetic complications: no    Last Vitals:  Vitals:   12/10/17 1345 12/10/17 1355  BP: (!) 136/98 (!) 138/94  Pulse: 95 (!) 101  Resp: 20 20  Temp:  36.9 C  SpO2: 97% 93%    Last Pain:  Vitals:   12/10/17 1330  TempSrc:   PainSc: Asleep                 [name]

## 2017-12-10 NOTE — Interval H&P Note (Signed)
History and Physical Interval Note:  Patient presented with radiocapitellar dislocation. Reviewed imaging and felt that closed reduction and likely revision of ulna would be most appropriate to provide stability to the elbow as there is slight apex posterior angulation. I would potentially supplement the revisions fixation with an external fixation device. Discussed with the patient and discussed risks and benefits and the patient agrees to proceed with surgery.  12/10/2017 8:22 AM  Randy Barker  has presented today for surgery, with the diagnosis of R elbow dislocation  The various methods of treatment have been discussed with the patient and family. After consideration of risks, benefits and other options for treatment, the patient has consented to  Procedure(s): CLOSED REDUCTION ELBOW POSSIBLE EXTERNAL FIXATION (Right) as a surgical intervention .  The patient's history has been reviewed, patient examined, no change in status, stable for surgery.  I have reviewed the patient's chart and labs.  Questions were answered to the patient's satisfaction.     [name]

## 2017-12-10 NOTE — Discharge Instructions (Addendum)
[facility] Discharge Instructions   General Discharge Instructions  WEIGHT BEARING STATUS: Nonweightbearing right arm  RANGE OF MOTION/ACTIVITY: Keep splint clean, dry and intact  Wound Care: Keep splint clean, dry and intact  DVT/PE prophylaxis: Continue aspirin  Diet: as you were eating previously.  Can use over the counter stool softeners and bowel preparations, such as Miralax, to help with bowel movements.  Narcotics can be constipating.  Be sure to drink plenty of fluids  PAIN MEDICATION USE AND EXPECTATIONS  You have likely been given narcotic medications to help control your pain.  After a traumatic event that results in an fracture (broken bone) with or without surgery, it is ok to use narcotic pain medications to help control one's pain.  We understand that everyone responds to pain differently and each individual patient will be evaluated on a regular basis for the continued need for narcotic medications. Ideally, narcotic medication use should last no more than 6-8 weeks (coinciding with fracture healing).   As a patient it is your responsibility as well to monitor narcotic medication use and report the amount and frequency you use these medications when you come to your office visit.   We would also advise that if you are using narcotic medications, you should take a dose prior to therapy to maximize you participation.  IF YOU ARE ON NARCOTIC MEDICATIONS IT IS NOT PERMISSIBLE TO OPERATE A MOTOR VEHICLE (MOTORCYCLE/CAR/TRUCK/MOPED) OR HEAVY MACHINERY DO NOT MIX NARCOTICS WITH OTHER CNS (CENTRAL NERVOUS SYSTEM) DEPRESSANTS SUCH AS ALCOHOL   STOP SMOKING OR USING NICOTINE PRODUCTS!!!!  As discussed nicotine severely impairs your body's ability to heal surgical and traumatic wounds but also impairs bone healing.  Wounds and bone heal by forming microscopic blood vessels (angiogenesis) and nicotine is a vasoconstrictor (essentially, shrinks blood vessels).  Therefore,  if vasoconstriction occurs to these microscopic blood vessels they essentially disappear and are unable to deliver necessary nutrients to the healing tissue.  This is one modifiable factor that you can do to dramatically increase your chances of healing your injury.    (This means no smoking, no nicotine gum, patches, etc)  DO NOT USE NONSTEROIDAL ANTI-INFLAMMATORY DRUGS (NSAID'S)  Using products such as Advil (ibuprofen), Aleve (naproxen), Motrin (ibuprofen) for additional pain control during fracture healing can delay and/or prevent the healing response.  If you would like to take over the counter (OTC) medication, Tylenol (acetaminophen) is ok.  However, some narcotic medications that are given for pain control contain acetaminophen as well. Therefore, you should not exceed more than 4000 mg of tylenol in a day if you do not have liver disease.  Also note that there are may OTC medicines, such as cold medicines and allergy medicines that my contain tylenol as well.  If you have any questions about medications and/or interactions please ask your doctor/PA or your pharmacist.      ICE AND ELEVATE INJURED/OPERATIVE EXTREMITY  Using ice and elevating the injured extremity above your heart can help with swelling and pain control.  Icing in a pulsatile fashion, such as 20 minutes on and 20 minutes off, can be followed.    Do not place ice directly on skin. Make sure there is a barrier between to skin and the ice pack.    Using frozen items such as frozen peas works well as the conform nicely to the are that needs to be iced.  USE AN ACE WRAP OR TED HOSE FOR SWELLING CONTROL  In addition to icing and  elevation, Ace wraps or TED hose are used to help limit and resolve swelling.  It is recommended to use Ace wraps or TED hose until you are informed to stop.    When using Ace Wraps start the wrapping distally (farthest away from the body) and wrap proximally (closer to the body)   Example: If you had surgery  on your leg or thing and you do not have a splint on, start the ace wrap at the toes and work your way up to the thigh        If you had surgery on your upper extremity and do not have a splint on, start the ace wrap at your fingers and work your way up to the upper arm  IF YOU ARE IN A SPLINT OR CAST DO NOT REMOVE IT FOR ANY REASON   If your splint gets wet for any reason please contact the office immediately. You may shower in your splint or cast as long as you keep it dry.  This can be done by wrapping in a cast cover or garbage back (or similar)  Do Not stick any thing down your splint or cast such as pencils, money, or hangers to try and scratch yourself with.  If you feel itchy take benadryl as prescribed on the bottle for itching  CALL THE OFFICE WITH ANY QUESTIONS OR CONCERNS: [phone]

## 2017-12-10 NOTE — Op Note (Signed)
OrthopaedicSurgeryOperativeNote (ZOX:[id number](CSN:[id number]) Date of Surgery: 12/10/2017  Admit Date: 12/10/2017   Diagnoses: Pre-Op Diagnoses: Radiocapitellar dislocation with ulnohumeral subluxation following complex right elbow fracture dislocation repair   Post-Op Diagnosis: Same  Procedures: Revision open treatment of periarticular fracture/dislocation right elbow  Surgeons: Primary: Mivaan Corbitt, Gillie MannersKevin P, MD Assisting: Bjorn PippinVarkey, Dax T, MD   Location:[facility] OR ROOM 07   AnesthesiaGeneral   Antibiotics:Ancef 2g preop   Tourniquettime: Total Tourniquet Time Documented: Upper Arm (Right) - 120 minutes Total: Upper Arm (Right) - 120 minutes   EstimatedBloodLoss:75 mL  Complications:None  Specimens:None  Implants: Implant Name Type Inv. Item Serial No. Manufacturer Lot No. LRB No. Used Action  HEAD RADIAL 12X22 - [id number] Head HEAD RADIAL 12X22  [employer](ORTH,TRAU,BIO,SG) [id number] Right 1 Implanted  JUGGERKNOT W/TAP NEEDLE SHOU - [id number] Anchor JUGGERKNOT W/TAP NEEDLE SHOU  [employer](ORTH,TRAU,BIO,SG) [id number] Right 1 Implanted  PUTTY DBM STAGRAFT PLUS 5CC - [id number] Putty PUTTY DBM STAGRAFT PLUS 5CC [id number] [employer](ORTH,TRAU,BIO,SG) [id number] Right 1 Implanted  SCREW LOW PROFILE 18MMX3.5MM - [id number] Screw SCREW LOW PROFILE 18MMX3.5MM  [employer](ORTH,TRAU,BIO,SG)  Right 3 Implanted  SCREW CORTICAL LOW PROF 3.5X20 - [id number] Screw SCREW CORTICAL LOW PROF 3.5X20  [employer](ORTH,TRAU,BIO,SG)  Right 4 Implanted  SCREW NON LOCKING LP 3.5 16MM - [id number] Screw SCREW NON LOCKING LP 3.5 16MM  [employer](ORTH,TRAU,BIO,SG)  Right 1 Implanted  SCREW CORTICAL 3.5X28MM - [id number] Screw SCREW CORTICAL 3.5X28MM  [employer](ORTH,TRAU,BIO,SG)  Right 1 Implanted  WASHER 3.5MM - [id number] Orthopedic Implant WASHER 3.5MM  [employer](ORTH,TRAU,BIO,SG)  Right 3 Implanted  PLATE OLECRANON LONG - [id number] Plate PLATE OLECRANON LONG  [employer](ORTH,TRAU,BIO,SG)  Right 1 Implanted   SCREW CORT LOCK 3.5MM 10MM - [id number] Screw SCREW CORT LOCK 3.5MM 10MM  [employer](ORTH,TRAU,BIO,SG)  Right 1 Implanted  SCREW CORT LOCK 3.5MM 18MM - [id number] Screw SCREW CORT LOCK 3.5MM 18MM  [employer](ORTH,TRAU,BIO,SG)  Right 1 Implanted  SCREW CORT LOCK 3.5MM 22MM - [id number] Screw SCREW CORT LOCK 3.5MM 22MM  [employer](ORTH,TRAU,BIO,SG)  Right 1 Implanted  SCREW CORT LOCK 3.5MM 32MM - [id number] Screw SCREW CORT LOCK 3.5MM 32MM  [employer](ORTH,TRAU,BIO,SG)  Right 1 Implanted    IndicationsforSurgery: This is a 52 year old male who fell off a stilts while drywalling.  He sustained a complex elbow fracture dislocation.  Included in his injuries and was a ulnar humeral dislocation, coronoid fracture, comminuted radial head fracture, proximal ulnar fracture and radial head dislocation with lateral ligament disruption.  I took him for fixation of his elbow approximately 3 weeks ago he reconstructed the above injuries.  He had done well initially but unfortunately return to clinic this week with a radiocapitellar dislocation or pain with any attempted range of motion of the elbow.  After thorough review of his perioperative and postoperative x-rays felt that his proximal ulna was in a slight amount of apex posterior angulation which likely led to his radiocapitellar instability.  In light of this and his dislocation felt that revision of his ulna fixation as well as possible re-repair of his lateral ligament complex and possible undersizing of his radial head was appropriate.  I also discussed with him the possibility performing a hinged external fixator versus a static external fixator if the ulnohumeral joint was unstable as well. Risks discussed included bleeding requiring blood transfusion, bleeding causing a hematoma, infection, malunion, nonunion, damage to surrounding nerves and blood vessels, pain, hardware prominence or irritation, hardware failure, stiffness, post-traumatic arthritis,  heterotopic ossification, DVT/PE, compartment syndrome, and even death. Risks and benefits were  extensively discussed as noted above and the patient and their family agreed to proceed with surgery and consent was obtained.  Operative Findings: 1.  Revision fixation of the ulna with removal of previous hardware and placement of [employer] long olecranon plate with bridging of proximal ulnar comminution. 2.  Downsizing of radial head arthroplasty to a 22 mm radial head and explant of a 24 mm radial head 3.  Revision repair of lateral collateral ligament complex with 2.9 mm juggernaut suture anchor. 4.  Stable radial capitellar joint in pronation after completion of the case.  Patient had continued subluxation of radial head with supination greater than 30 degrees.  Procedure: The patient was identified in the preoperative holding area. Consent was confirmed with the patient and their family and all questions were answered. The operative extremity was marked after confirmation with the patient. he was then brought back to the operating room by our anesthesia colleagues.  The patient was carefully transferred over to a radiolucent flat top table.  He was then placed under general anesthetic.  I first started out by performing contralateral radiographs of his elbow and forearm.  I obtained a perfect lateral of the elbow joint along with a supinated AP view of his elbow and pronated AP of his elbow as well as a AP of a full-length forearm film.  I save these images for comparison for his right side.  I then turned my attention to the right upper extremity.  I obtained images which showed a posterior lateral radial head dislocation with some mild subluxation of the ulnar humeral joint.  I was able to reduce the ulnohumeral joint but was unsuccessful in reducing the radial capitellar joint. The operative extremity was then prepped and draped in usual sterile fashion. A preoperative timeout was performed to  verify the patient, the procedure, and the extremity. Preoperative antibiotics were dosed.  I then placed a sterile tourniquet and exsanguinated the extremity and elevated the tourniquet to 250 mmHg.  Total tourniquet time was a total of 2 hours.  I first entered through my previous surgical incision.  I carried this down through skin and subcutaneous tissue.  My previous sutures of the fascia of the forearm were excised and I encountered my ulnar plates.  I proceeded to remove these plate without difficulty.  2 plates were in place, a radial plate and a dorsal one third tubular plate.  These were both removed to be able to access the fracture and to correct the angulation that was causing the radiocapitellar instability.  I then again attempted to clamp the fracture in place but was unsuccessful with fully being able to reduce the fracture.  I attempted to place a longer radial plate along the ulna but unfortunately this comminuted the ulna fracture more.  At this point I felt that dorsal fixation and plating with a long olecranon plate was most appropriate.   I extended my incision proximally to be able to lift a skin flap to be able to access the posterior aspect of the olecranon.  Using a [employer] long olecranon plate I pinned the plate to the proximal fragment and used clamps and pins to reduce the distal segment to the plate.  I confirmed adequate reduction of the ulna with fluoroscopy and the radiocapitellar joint felt more stable after appropriate provisional fixation.  At this point I felt that with the clinical exam of the joint being more stable as well as the radiographic exam showing that the angulation had  been corrected I felt that it would be appropriate to place screws at this juncture.  I placed 5 proximal screws in the olecranon fragment and then placed for nonlocking screws into the distal segment thereby completing my construct.  There was significant comminution along the location of  the previous fracture that at had some bone void.  At this point I then tested since the stability of the radiocapitellar joint.  I had significant improvement in pronation however past neutral with any supination the radiocapitellar joint continued to dislocate.  I felt that a component of it may be due to the size of the radial head replacement.  I then opened the radiocapitellar joint once more.  I removed the radial head and visualized the proximal radial ulnar joint to see if there was any blocks to motion.  I then proceeded to remove the stem that was in place and broached the radial neck once more to see if I can get a trajectory more in line with the radius.  Even with removal of the radial head prosthesis I still had a block to motion.  After visualizing on imaging and palpating the fractures I felt that the radial tuberosity was impinging on some of the comminution of the ulna.  I felt that proceeding by removing this comminution would not be appropriate as it likely would increase the risk of heterotopic ossification which he is at high risk already.  With the radial head and neck prosthesis out I still was not able to get past 60 degrees of supination.  I visualized the proximal radial ulnar joint which showed that my coronoid fragment was tied down with a small portion that was impinging on the proximal radial ulnar joint, however I felt that it was not impeding the motion of the supination.  I then replaced the stem and trialed off this with a 22 mm head which was downsized by 2 mm.  I felt that the stability was improved with this smaller size.  I then opened the final implant and placed this in the joint.  I felt that I had addressed all that I could at this juncture with the radiocapitellar joint other than repairing the lateral ligament complex once more.  A 2.9 mm junk do not suture anchor was placed in the lateral epicondyle.  The FiberWire suture was used to throw running locking stitches  to grab the soft tissue of the lateral ligament complex as well as the mobile wad to close down the interval and provide a restraint to subluxation of the radial head.  I then proceeded to keep the forearm in pronation to prevent any subluxation or strain of the repair that I just made.  Final fluoroscopic images were obtained.  I did not obtain AP and supination because I was concerned about the subluxation of the joint.  I did not test the stability after the lateral ligament repair was completed.  A lateral fluoroscopic image showed that the radiocapitellar joint was well reduced.  The angulation was corrected on the ulna fixation and the AP pronation showed that the radial head was reduced as well.  I then proceeded to thoroughly irrigate the wound.  I placed 5 mm of demineralized bone matrix in the bone void of the comminution of the ulna fracture.  A gram of vancomycin powder 1.2 g of tobramycin powder were placed in the wounds.  I performed a layered closure of 0 Vicryl, 2-0 Vicryl and 3-0 nylon.  The patient was then  placed in a well-padded splint after the wound was dressed with bacitracin ointment, Adaptic, 4 x 4's and sterile cast padding.  The forearm was placed in pronation and the elbow was held at 90 degrees of flexion.  The patient was then awoken from anesthesia and taken the PACU in stable condition.  Post Op Plan/Instructions: The patient will be nonweightbearing to the right upper extremity.  He will be discharged home today if his pain is well controlled.  He will continue with aspirin for DVT prophylaxis.  We will plan to have him return in approximately 1-2 weeks for repeat x-rays in his splint to make sure that his radiocapitellar joint is still located.  Will likely discontinue immobilization after 2-3 weeks.  I was present and performed the entire surgery.  Truitt Merle, MD [facility]

## 2017-12-10 NOTE — Progress Notes (Addendum)
Friend Randy Barker [phone]  Speaks alittle english, but for interpretor, call [phone] or the [facility] office. Did the betadine swab in his nostrils, as of 3/13 nasal swab came back positive for staph. Did have intrepreter this am, but she will be leaving at 0900.

## 2017-12-10 NOTE — Anesthesia Procedure Notes (Signed)
Procedure Name: Intubation Date/Time: 12/10/2017 8:46 AM Performed by: Shirlyn Goltz, CRNA Pre-anesthesia Checklist: Patient identified, Emergency Drugs available, Suction available and Patient being monitored Patient Re-evaluated:Patient Re-evaluated prior to induction Oxygen Delivery Method: Circle system utilized Preoxygenation: Pre-oxygenation with 100% oxygen Induction Type: IV induction Ventilation: Mask ventilation without difficulty Laryngoscope Size: Mac and 4 Grade View: Grade I Tube type: Oral Tube size: 7.5 mm Number of attempts: 1 Airway Equipment and Method: Stylet Placement Confirmation: ETT inserted through vocal cords under direct vision,  positive ETCO2 and breath sounds checked- equal and bilateral Secured at: 21 cm Tube secured with: Tape Dental Injury: Teeth and Oropharynx as per pre-operative assessment

## 2017-12-10 NOTE — Progress Notes (Signed)
Orthopedic Tech Progress Note Patient Details:  Randy RamalFidel Barker 1966/04/01 [id number]  Ortho Devices Type of Ortho Device: Arm sling Ortho Device/Splint Location: rue Ortho Device/Splint Interventions: Application   Post Interventions Patient Tolerated: Well Instructions Provided: Care of device   Nikki DomCrawford, [name] 12/10/2017, 2:52 PM

## 2017-12-10 NOTE — Transfer of Care (Signed)
Immediate Anesthesia Transfer of Care Note  Patient: Randy Barker  Procedure(s) Performed: Revision Open Reduction Internal Fixation of Right Elbow (Right Elbow)  Patient Location: PACU  Anesthesia Type:General  Level of Consciousness: awake, alert , oriented and patient cooperative  Airway & Oxygen Therapy: Patient Spontanous Breathing and Patient connected to nasal cannula oxygen  Post-op Assessment: Report given to RN and Post -op Vital signs reviewed and stable  Post vital signs: Reviewed and stable  Last Vitals:  Vitals Value Taken Time  BP 155/97 12/10/2017 12:55 PM  Temp    Pulse 101 12/10/2017 12:55 PM  Resp 16 12/10/2017 12:55 PM  SpO2 99 % 12/10/2017 12:55 PM  Vitals shown include unvalidated device data.  Last Pain:  Vitals:   12/10/17 0654  TempSrc:   PainSc: 3          Complications: No apparent anesthesia complications

## 2017-12-11 ENCOUNTER — Encounter (HOSPITAL_COMMUNITY): Payer: Self-pay | Admitting: Student

## 2017-12-16 ENCOUNTER — Encounter (HOSPITAL_COMMUNITY): Payer: Self-pay | Admitting: Student

## 2018-01-15 DIAGNOSIS — S53004A Unspecified dislocation of right radial head, initial encounter: Secondary | ICD-10-CM | POA: Insufficient documentation

## 2018-11-08 IMAGING — CR DG HUMERUS 2V *R*
4 series · 4 of 4 positions shown · non-contrast
Comparison: No recent prior.

CLINICAL DATA: Fall off of a platform.  Pain and bony deformity.

EXAM:
RIGHT HUMERUS - 2+ VIEW

[x humerus ap right]
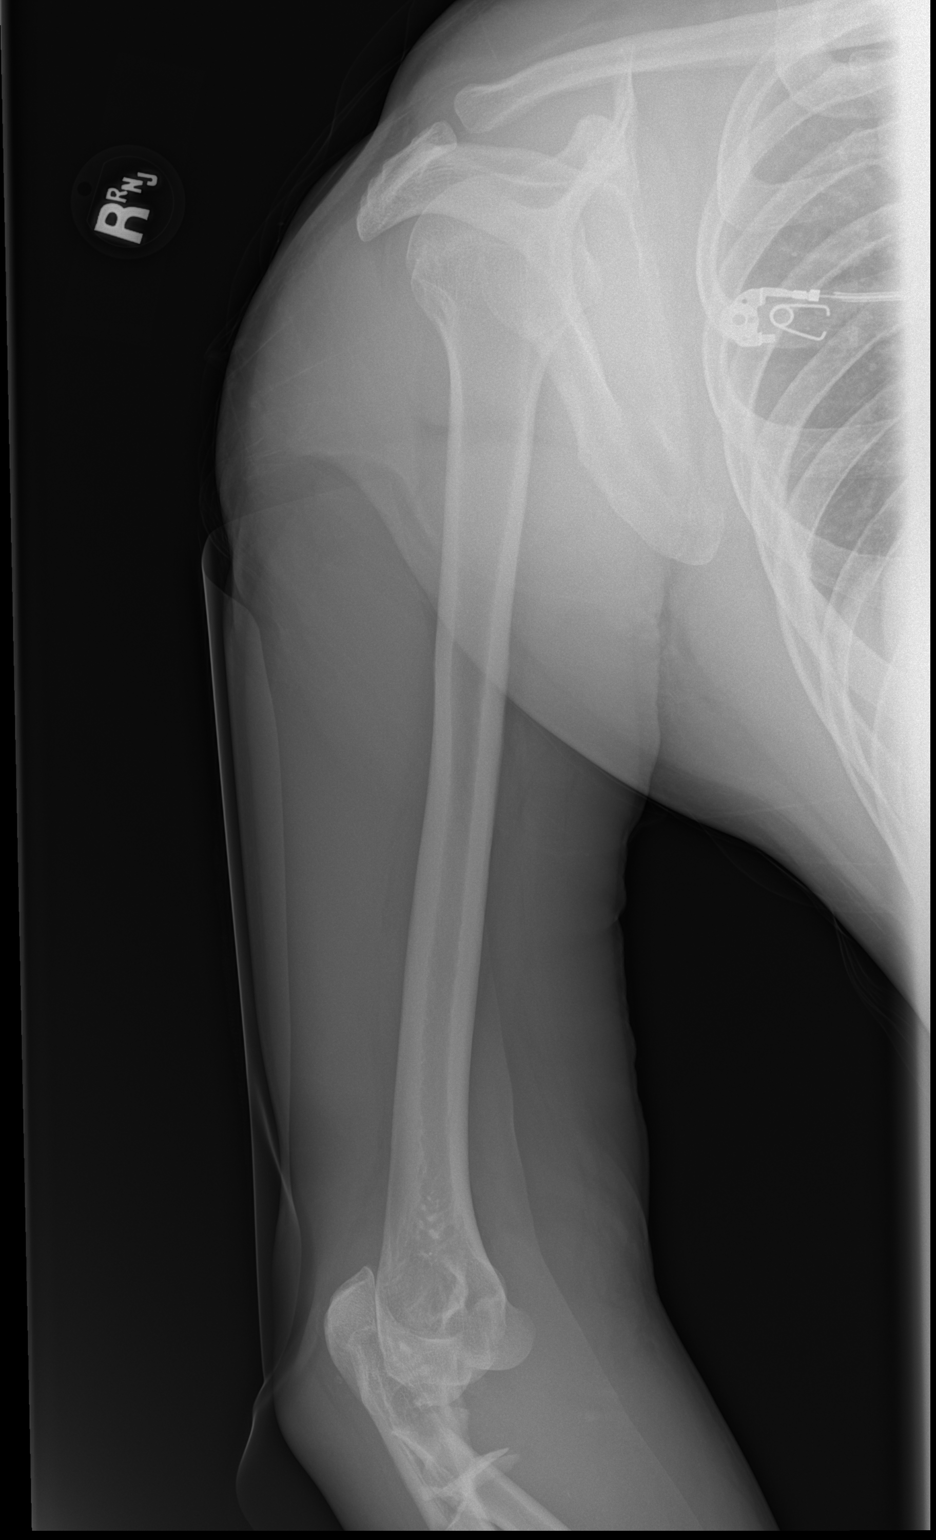

[w trans-thoracic humerus (1 of 3)]
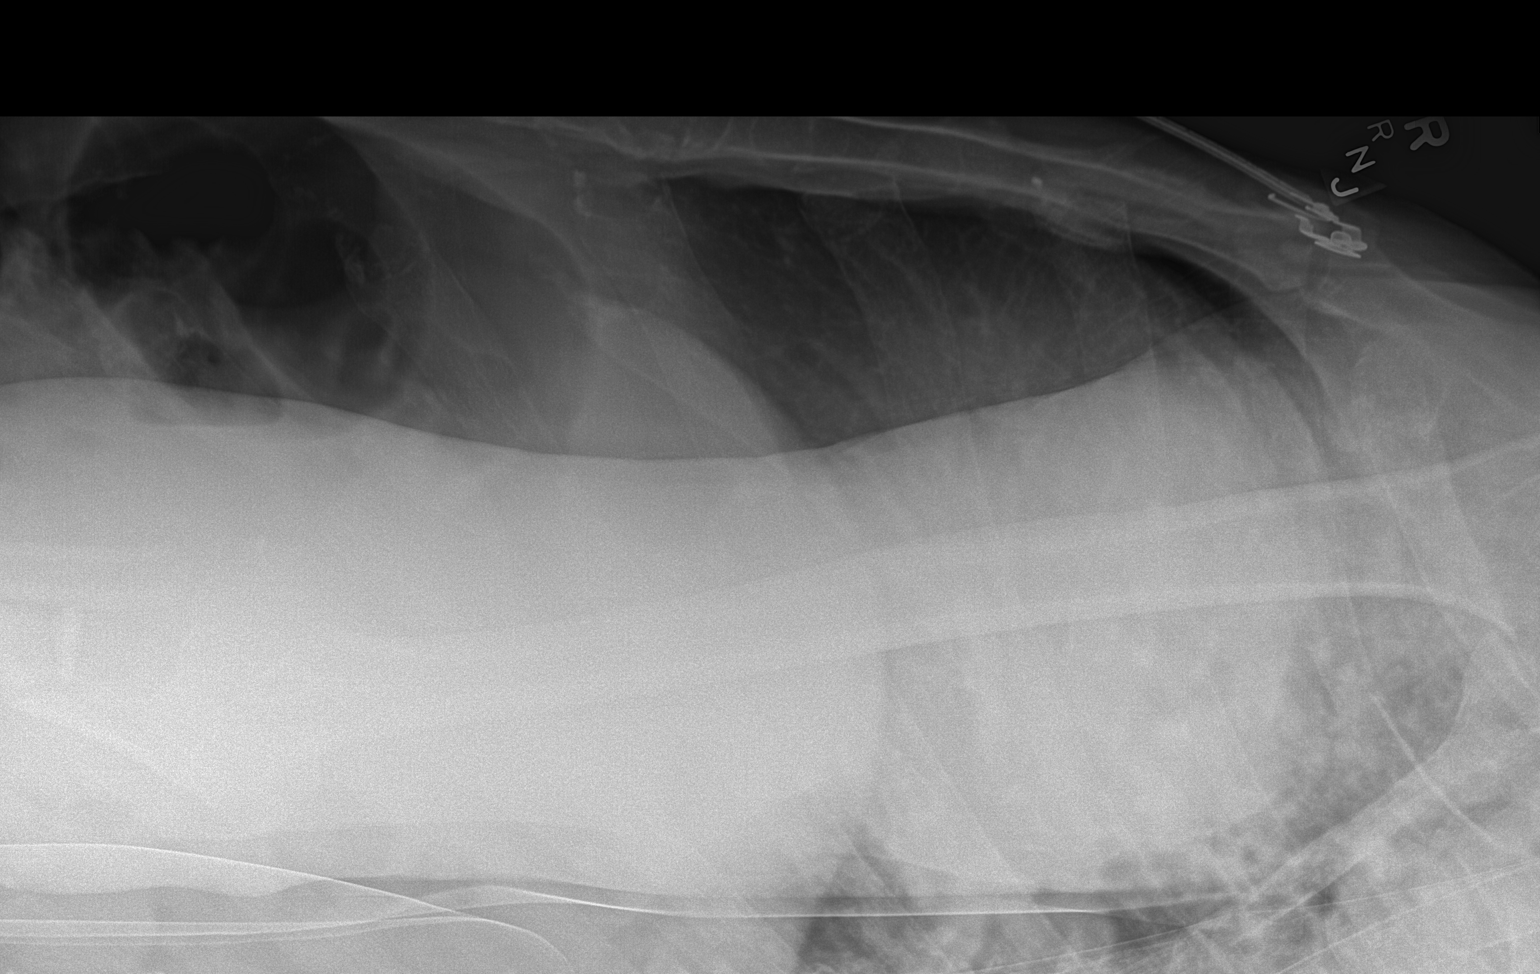

[w trans-thoracic humerus (2 of 3)]
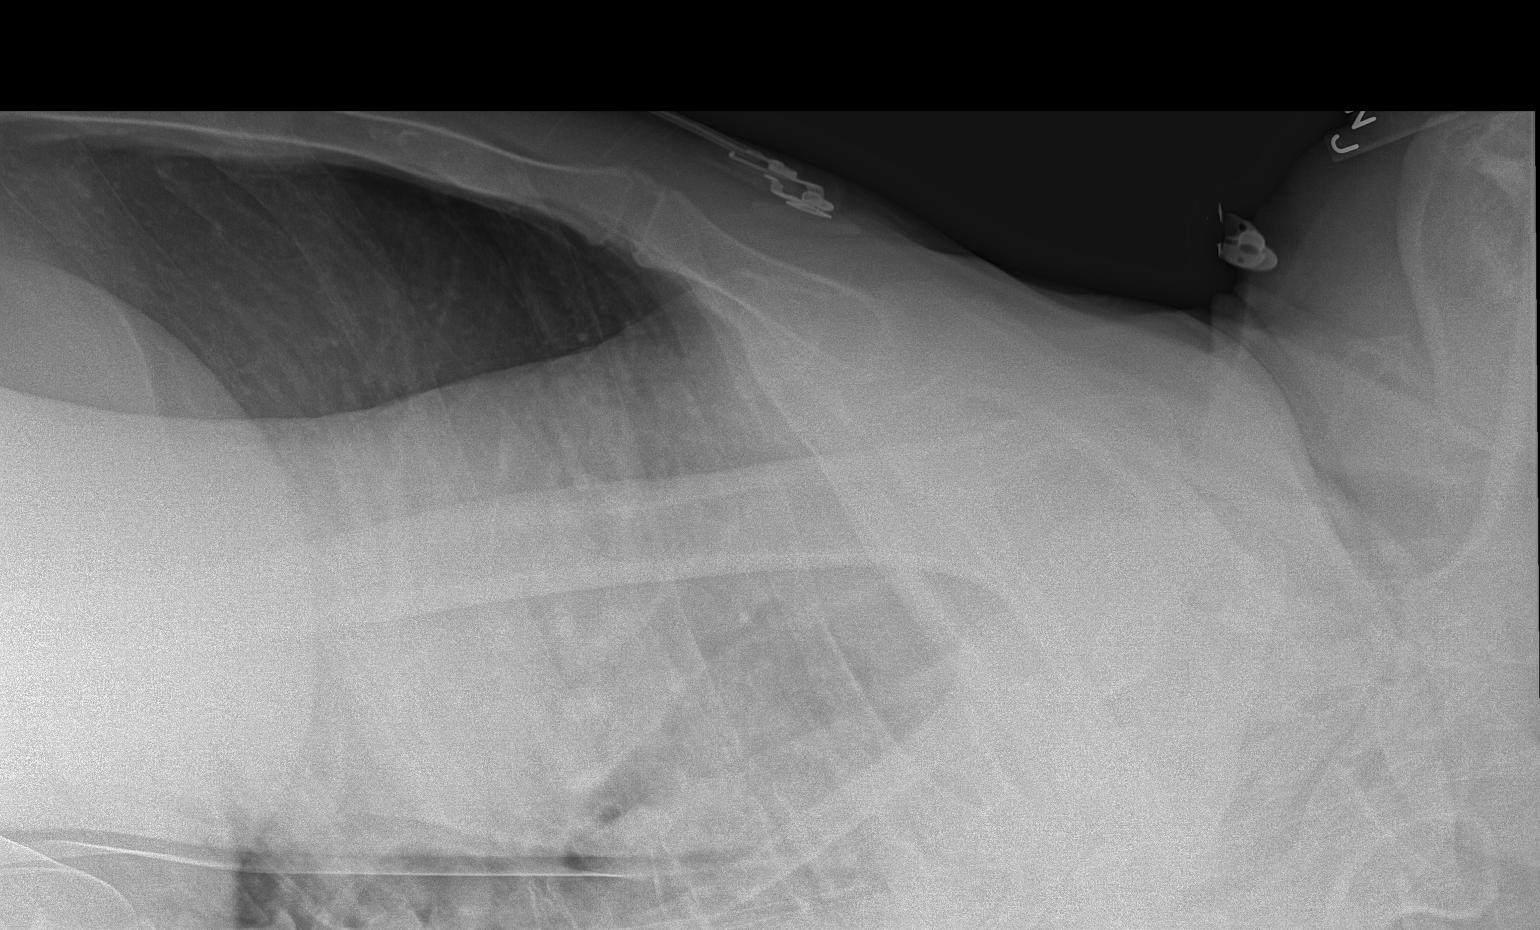

[w trans-thoracic humerus (3 of 3)]
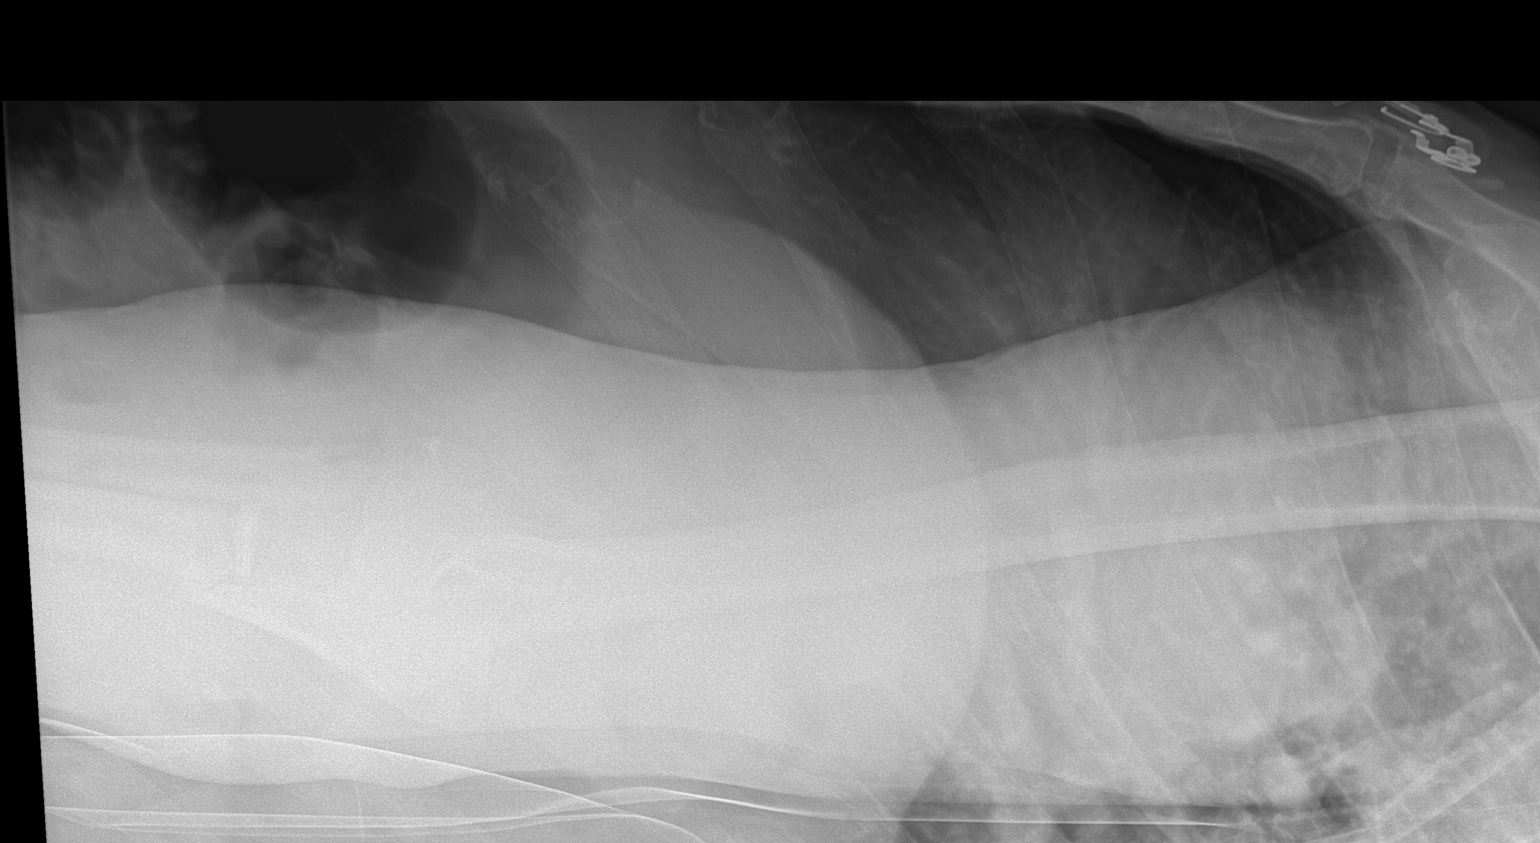

[4 of 4 positions shown; findings below may reference images not displayed]

FINDINGS: There appears to be a fracture/dislocation involving the right
elbow. Reference is made to the right elbow series report. Proximal
humerus intact.
IMPRESSION: 1. There appears to be a fracture dislocation involving the right
elbow. Reference is made to right elbow series report of the same
day.

2.  Proximal right humerus appears to be intact.

## 2018-11-10 IMAGING — DX DG ELBOW 2V*L*
1 series · 3 of 3 positions shown · non-contrast
Comparison: 11/20/2017.

CLINICAL DATA: History of fracture.  Radial head replacement.

EXAM:
LEFT ELBOW - 2 VIEW

[Series 1: elbow · 0.14mm/px · 3 of 3 slices shown]
[im 1/3]
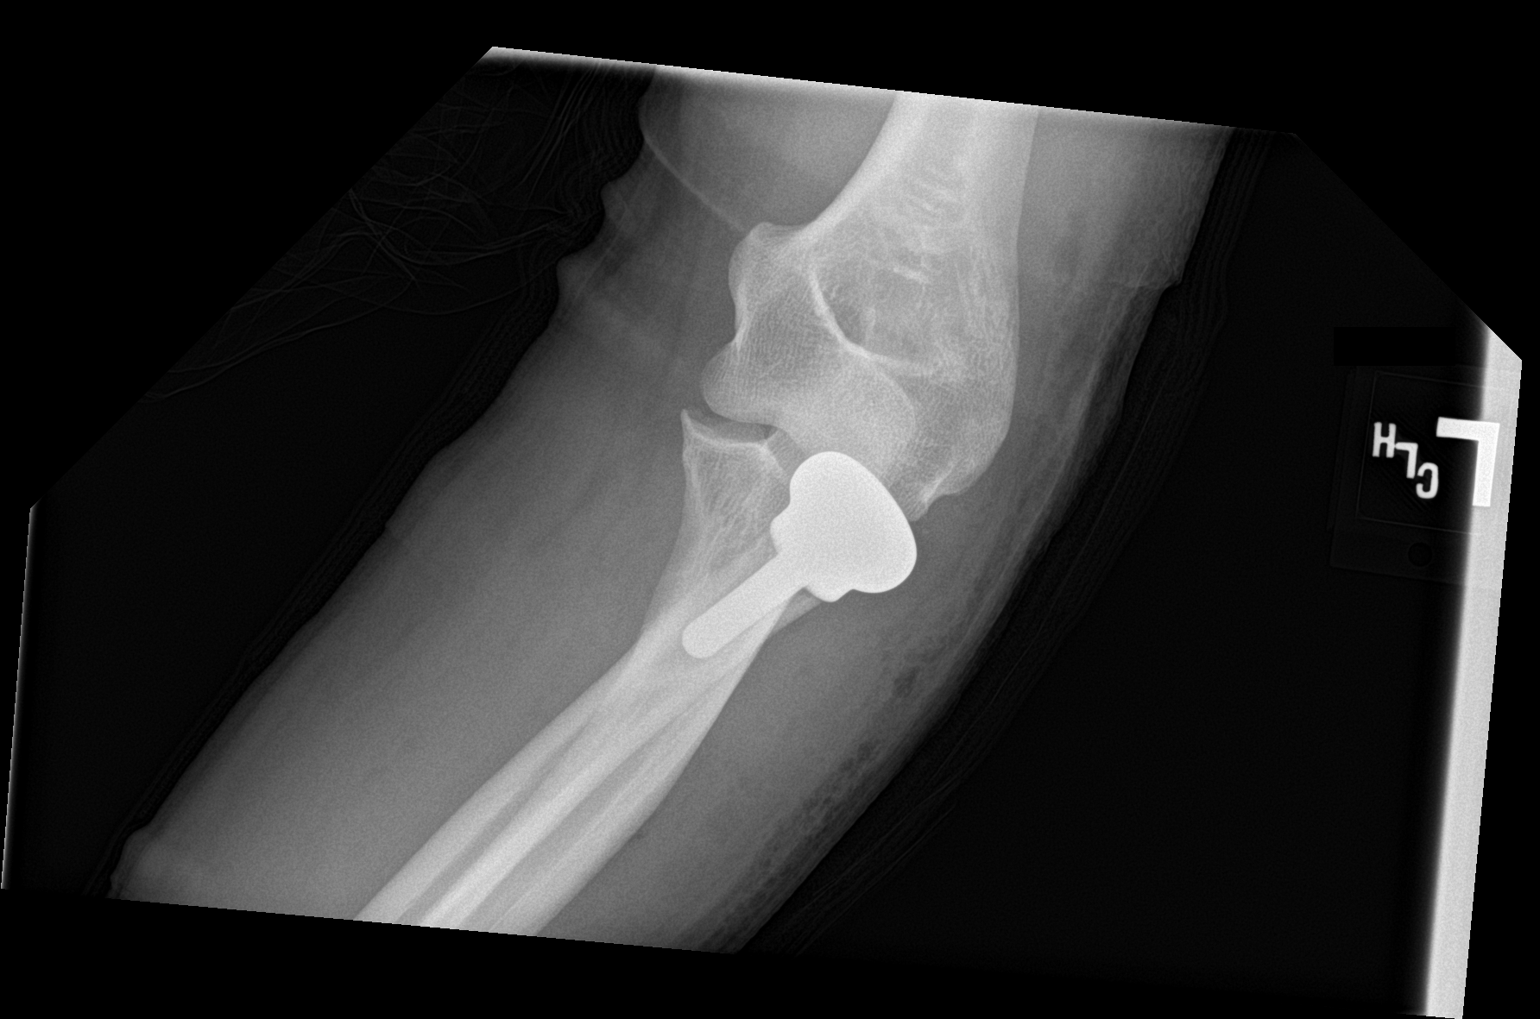
[im 2/3]
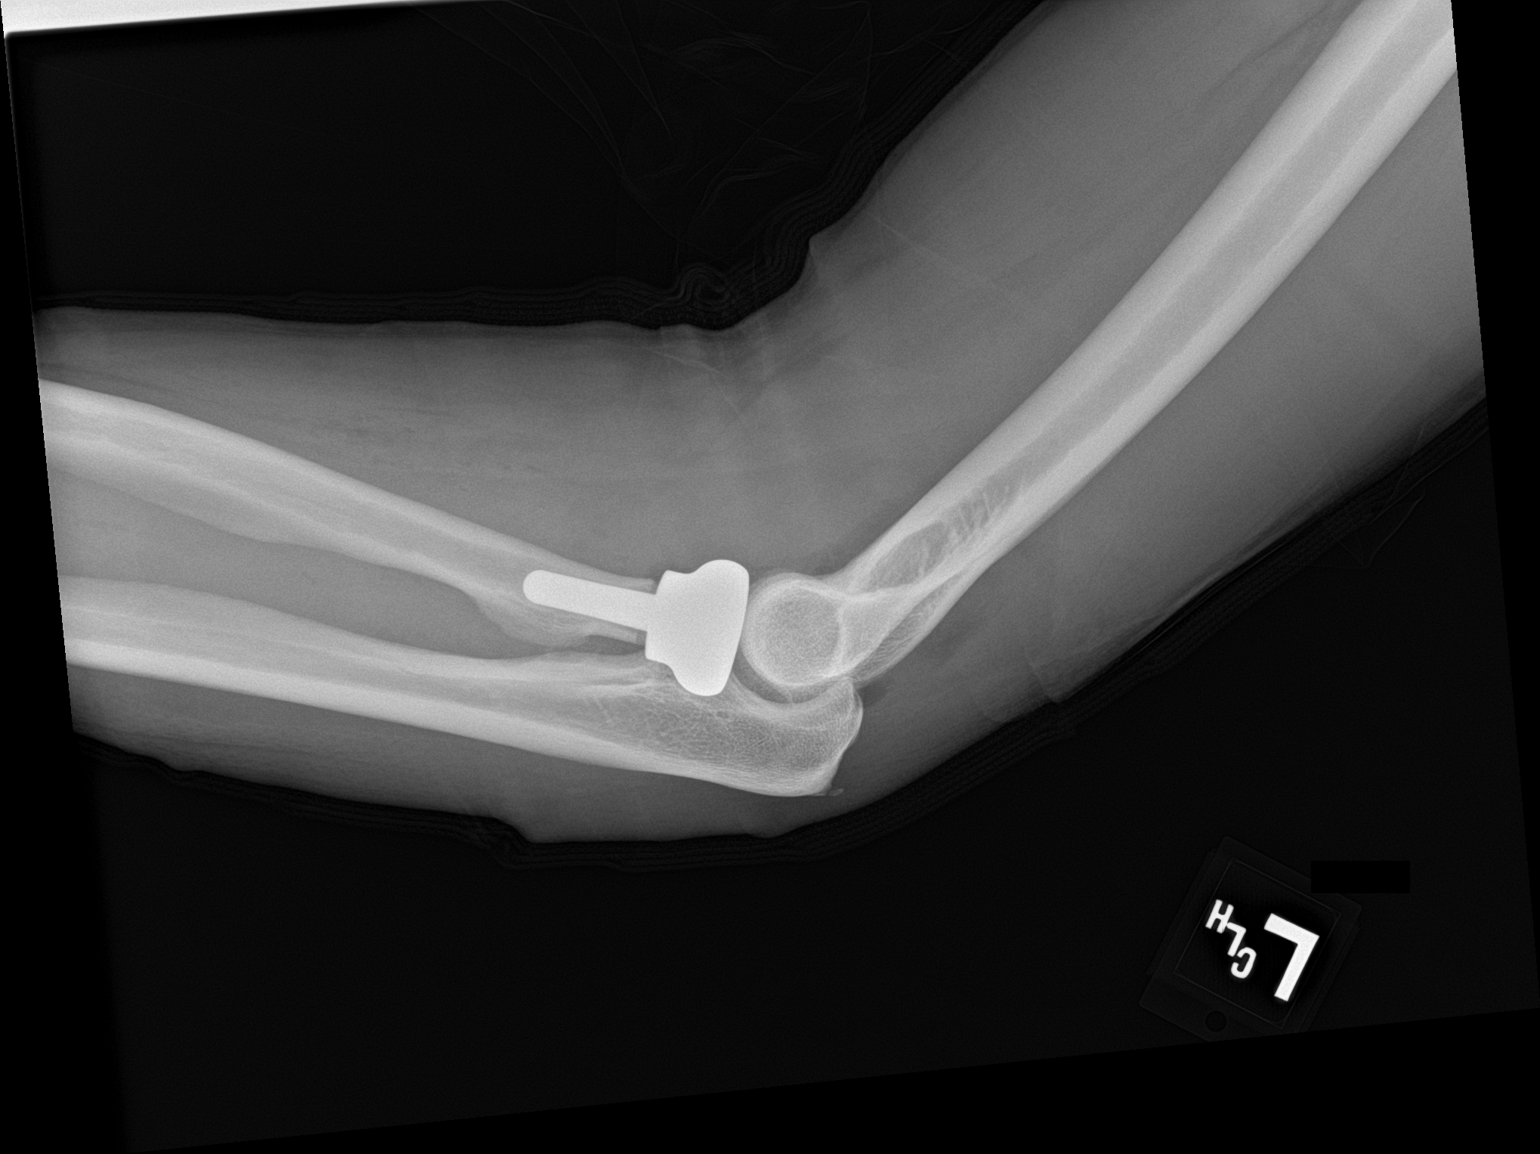
[im 3/3]
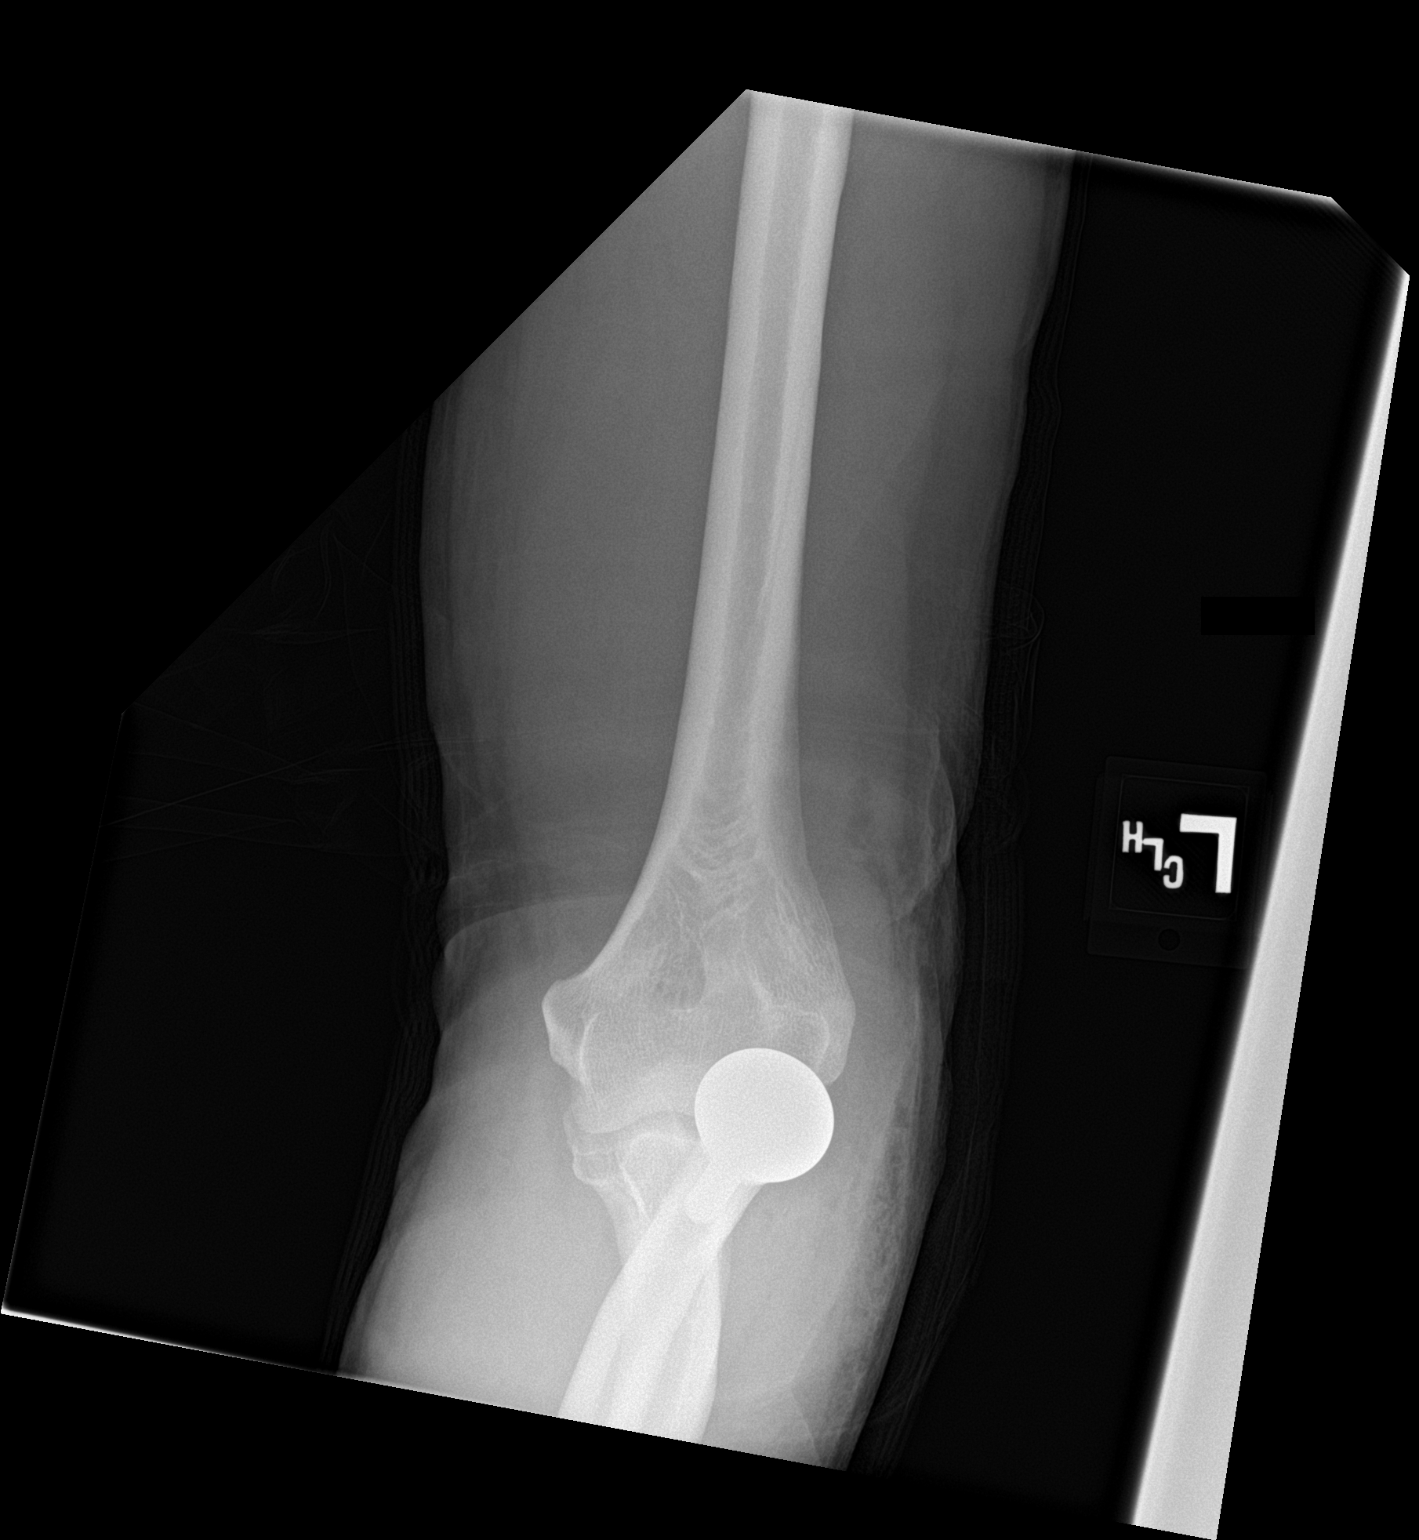

[3 of 3 positions shown; findings below may reference images not displayed]

FINDINGS: Radial head replacement. Hardware intact. Anatomic alignment on one
view.
IMPRESSION: Radial head replacement with anatomic alignment on one view.

## 2022-08-13 ENCOUNTER — Emergency Department (HOSPITAL_COMMUNITY)
Admission: EM | Admit: 2022-08-13 | Discharge: 2022-08-13 | Disposition: A | Discharge status: Home / Self Care | Payer: Self-pay | Attending: Emergency Medicine | Admitting: Emergency Medicine

## 2022-08-13 ENCOUNTER — Encounter (HOSPITAL_COMMUNITY): Payer: Self-pay

## 2022-08-13 ENCOUNTER — Other Ambulatory Visit: Payer: Self-pay

## 2022-08-13 DIAGNOSIS — K644 Residual hemorrhoidal skin tags: Secondary | ICD-10-CM | POA: Insufficient documentation

## 2022-08-13 MED ORDER — LIDOCAINE 3 % EX CREA: 1.0000 | TOPICAL_CREAM | Freq: Two times a day (BID) | CUTANEOUS | 0 refills | Status: AC | PRN

## 2022-08-13 MED ORDER — HYDROCORTISONE 1 % EX CREA: TOPICAL_CREAM | CUTANEOUS | 0 refills | Status: AC

## 2022-08-13 MED ORDER — ACETAMINOPHEN 325 MG PO TABS
650.0000 mg | ORAL_TABLET | Freq: Once | ORAL | Status: AC
  Administered 2022-08-13: 650 mg via ORAL
  Filled 2022-08-13: qty 2

## 2022-08-13 NOTE — ED Triage Notes (Signed)
Patient has had rectal pain for 8-10 years but it got worse 2 weeks ago. He thinks he has hemorrhoids.

## 2022-08-13 NOTE — Discharge Instructions (Addendum)
You were seen in the emergency department today for pain around your rectum. This is due to hemorrhoids. I have prescribed you 2 creams to apply to help with symptoms. I have also attached some reading about hemorrhoids and things you can do at home to help your symptoms. I am referring you to a general surgeon to have these removed. If they do not call you in 72 hours, please call them to schedule an appointment. I have placed their information in your discharge paperwork. Please return for significant bleeding from you bottom.   Lo atendieron hoy en el departamento de emergencias por dolor alrededor del recto. Esto se debe a las hemorroides. Te he recetado 2 cremas para que te apliques y te ayuden con los sntomas. Tambin adjunto algunas [employer] hemorroides y cosas que puedes hacer en casa para aliviar tus sntomas. Lo remito a un cirujano general para que se los extraiga. Si no lo llaman en 72 horas, llmelos para programar una cita. He colocado su informacin en su documentacin de alta. Por favor regrese si sangra mucho de su trasero.

## 2022-08-13 NOTE — ED Provider Notes (Signed)
[facility]-EMERGENCY DEPT Provider Note   CSN: [id number] Arrival date & time: 08/13/22  3818     History  Chief Complaint  Patient presents with   Rectal Pain    Randy Barker is a 56 y.o. male. With past medical history of GERD who presents to the emergency department with rectal pain.  States that for two weeks he has had perianal pain. States the pain is throbbing and constant. Also endorses itching and pain with wiping. He states he had seen a doctor previously who told him he had hemorrhoids but has not followed up on this. He is using a cream to help with symptoms. He is unsure of the name of the cream. He states he is having difficulty even wiping himself secondary to having pain and is embarrassed because he feels dirty after having a bowel movement. He denies constipation, diarrhea, pain with defecation, abdominal pain nausea or vomiting, bloody bowel movements, fever, dysuria.   HPI     Home Medications Prior to Admission medications   Medication Sig Start Date End Date Taking? Authorizing Provider  hydrocortisone cream 1 % Apply to affected area 2 times daily 08/13/22  Yes Cherlynn Polo, Mihaela Fajardo E, PA-C  Lidocaine 3 % CREA Apply 1 Dose topically 2 (two) times daily as needed. 08/13/22  Yes Cristopher Peru, PA-C  methocarbamol (ROBAXIN-750) 750 MG tablet Take 1 tablet (750 mg total) by mouth every 6 (six) hours as needed for muscle spasms. 11/21/17   Haddix, Gillie Manners, MD  oxyCODONE-acetaminophen (PERCOCET) 5-325 MG tablet Take 1-2 tablets by mouth every 4 (four) hours as needed for severe pain. 12/10/17   Haddix, Gillie Manners, MD      Allergies    Patient has no known allergies.    Review of Systems   Review of Systems  Gastrointestinal:  Positive for rectal pain.  All other systems reviewed and are negative.   Physical Exam Updated Vital Signs BP (!) 179/109   Pulse (!) 57   Temp 98.1 F (36.7 C) (Oral)   Resp 18   SpO2 100%  Physical Exam Vitals and  nursing note reviewed. Exam conducted with a chaperone present.  Constitutional:      Appearance: Normal appearance.  HENT:     Head: Normocephalic.  Eyes:     General: No scleral icterus. Cardiovascular:     Rate and Rhythm: Normal rate and regular rhythm.  Pulmonary:     Effort: Pulmonary effort is normal.     Breath sounds: Normal breath sounds.  Abdominal:     General: Abdomen is protuberant. Bowel sounds are normal. There is no distension.     Palpations: Abdomen is soft.     Tenderness: There is no abdominal tenderness.  Genitourinary:    Rectum: Tenderness and external hemorrhoid present. No mass, anal fissure or internal hemorrhoid.     Comments: External hemorrhoid. Tender to palpation.  Skin:    General: Skin is warm and dry.     Capillary Refill: Capillary refill takes less than 2 seconds.  Neurological:     General: No focal deficit present.     Mental Status: He is alert.  Psychiatric:        Mood and Affect: Mood normal.        Behavior: Behavior normal.     ED Results / Procedures / Treatments   Labs (all labs ordered are listed, but only abnormal results are displayed) Labs Reviewed - No data to display  EKG None  Radiology No results found.  Procedures Procedures    Medications Ordered in ED Medications  acetaminophen (TYLENOL) tablet 650 mg (650 mg Oral Given 08/13/22 1120)    ED Course/ Medical Decision Making/ A&P                           Medical Decision Making Initial Impression and Ddx 56 year old male who presents to the emergency department with rectal pain. GU exam was performed with chaperone present confirms presence of external hemorrhoids. Not thrombosed but it is enlarged. Tender to palpation. No masses or obvious rectal bleeding or anal fissure Patient PMH that increases complexity of ED encounter:  GERD  Interpretation of Diagnostics I independent reviewed and interpreted the labs as followed: not indicated   - I  independently visualized the following imaging with scope of interpretation limited to determining acute life threatening conditions related to emergency care: not indicated   Patient Reassessment and Ultimate Disposition/Management Hemorrhoids on exam  Will refer to general surgery, as he wishes to have them removed at this time.  Will prescribe preparation H cream and lidocaine cream to help with symptoms. Will also give dc instructions for sitz baths etc.   No indication for further lab or imaging work up.   Patient is agreeable to plan. Return precautions for rectal bleeding, obstipation.   Patient management required discussion with the following services or consulting groups:  None  Complexity of Problems Addressed Acute uncomplicated illness or injury with no diagnostics  Additional Data Reviewed and Analyzed Further history obtained from: Care Everywhere  Patient Encounter Risk Assessment Prescriptions and SDOH impact on management  Final Clinical Impression(s) / ED Diagnoses Final diagnoses:  External hemorrhoid    Rx / DC Orders ED Discharge Orders          Ordered    Ambulatory referral to General Surgery        08/13/22 1517    hydrocortisone cream 1 %        08/13/22 1519    Lidocaine 3 % CREA  2 times daily PRN        08/13/22 1519              Cristopher Peru, PA-C 08/13/22 1519    Jacalyn Lefevre, MD 08/13/22 1614

## 2022-08-13 NOTE — ED Provider Triage Note (Signed)
Emergency Medicine Provider Triage Evaluation Note  Fontaine Hehl , a 56 y.o. male  was evaluated in triage.  Pt complains of rectal pain. Started 2 weeks ago. Pain is in the rectum. Hurts worse with passing BM. Also endorses some bright red bleeding with BM at times. Has taken tylenol for pain. Denies abdominal pain or changes in urination. Noticed some bumps about a week ago around his anus that have been very painful.  Review of Systems  Positive: See above Negative: See above  Physical Exam  BP (!) 181/97   Pulse 66   Temp 97.6 F (36.4 C) (Oral)   Resp 18   SpO2 99%  Gen:   Awake, no distress   Resp:  Normal effort  MSK:   Moves extremities without difficulty  Other:   Medical Decision Making  Medically screening exam initiated at 11:16 AM.  Appropriate orders placed.  Jonatha Gagen was informed that the remainder of the evaluation will be completed by another provider, this initial triage assessment does not replace that evaluation, and the importance of remaining in the ED until their evaluation is complete.  Work up started   Gareth Eagle, PA-C 08/13/22 1120

## 2022-08-27 ENCOUNTER — Encounter: Payer: Self-pay | Admitting: Surgery

## 2022-08-27 ENCOUNTER — Ambulatory Visit: Payer: Self-pay | Admitting: Surgery

## 2022-08-27 DIAGNOSIS — K449 Diaphragmatic hernia without obstruction or gangrene: Secondary | ICD-10-CM | POA: Insufficient documentation

## 2022-11-05 ENCOUNTER — Other Ambulatory Visit: Payer: Self-pay

## 2022-11-05 ENCOUNTER — Encounter (HOSPITAL_COMMUNITY): Payer: Self-pay | Admitting: Surgery

## 2022-11-05 NOTE — Progress Notes (Signed)
COVID Vaccine Completed:  Yes x2  Date of COVID positive in last 90 days: No  PCP - No PCP Cardiologist - N/A  Chest x-ray - N/A EKG - N/A Stress Test - N/A ECHO - N/A Cardiac Cath - N/A Pacemaker/ICD device last checked: Spinal Cord Stimulator:N/A  Bowel Prep - Yes, clear liquids/pureed foods the day before surgery.  MOM at 10 am and 2 pm the day before surgery.  Patient has prep instructions   Sleep Study - N/A CPAP -   Fasting Blood Sugar - N/A Checks Blood Sugar _____ times a day  Last dose of GLP1 agonist-  N/A GLP1 instructions:  N/A   Last dose of SGLT-2 inhibitors-  N/A SGLT-2 instructions: N/A  Blood Thinner Instructions:N/A Aspirin Instructions: Last Dose:  Activity level:  Can go up a flight of stairs and perform activities of daily living without stopping and without symptoms of chest pain or shortness of breath.  Able to exercise without symptoms  Anesthesia review:  Patient states that he developed a fever and cough on 10-27-22.  Took a home Covid test which was negative.  States that he has been fever free for 24 hours and cough much improved today.  No cough noted during phone interview.  Jessica Ward, PA-C made aware.  Patient denies shortness of breath, fever, and chest pain at PAT appointment (Completed over the phone)  Patient verbalized understanding of instructions that were given to them at the PAT appointment. Patient was also instructed that they will need to review over the PAT instructions again at home before surgery.

## 2022-11-05 NOTE — Progress Notes (Signed)
Anesthesia Chart Review   Case: [id number] Date/Time: 11/07/22 0915   Procedures:      HEMORRHOIDECTOMY WITH LIGATION AND HEMORRHOIDOPEXY     EXAM UNDER ANESTHESIA   Anesthesia type: General   Pre-op diagnosis: HEMORRHOIDS WITH BLEEDING AND PAIN   Location: [facility] ROOM 02 / [facility]   Surgeons: Michael Boston, MD       DISCUSSION:56 y.o. never smoker with h/o HTN, GERD, hemorrhoids with bleeding and pain scheduled for above procedure 11/07/2022   Pt reports fever and cough on 10/27/2022.  Reports fever has subsided, still experiencing intermittent cough.  Per guidelines pt should be postponed 4 weeks from URI onset.  Discussed with Dr. Johney Maine triage nurse.   Pt not seen in clinic, same day workup. VS: Ht '5\' 5"'$  (1.651 m)   Wt 71.2 kg   BMI 26.13 kg/m   PROVIDERS: Patient, No Pcp Per   LABS:  same day workup (all labs ordered are listed, but only abnormal results are displayed)  Labs Reviewed - No data to display   IMAGES:   EKG:   CV:  Past Medical History:  Diagnosis Date   Anemia    GERD (gastroesophageal reflux disease)    occ   Headache(784.0)    Hypertension    10 years ago, no meds    Past Surgical History:  Procedure Laterality Date   CLOSED REDUCTION ELBOW FRACTURE Right 12/10/2017   Procedure: Revision Open Reduction Internal Fixation of Right Elbow;  Surgeon: Shona Needles, MD;  Location: [place];  Service: Orthopedics;  Laterality: Right;   INGUINAL HERNIA REPAIR Bilateral 01/25/2013   Procedure: LAPAROSCOPIC BILATERAL INGUINAL HERNIA REPAIR;  Surgeon: Ralene Ok, MD;  Location: [place];  Service: General;  Laterality: Bilateral;   INSERTION OF MESH Bilateral 01/25/2013   Procedure: INSERTION OF MESH;  Surgeon: Ralene Ok, MD;  Location: [place];  Service: General;  Laterality: Bilateral;   ORIF ELBOW FRACTURE Right 11/19/2017   Procedure: OPEN REDUCTION INTERNAL FIXATION (ORIF)BILATERAL ELBOW/OLECRANON FRACTURES;  Surgeon: Shona Needles, MD;  Location:  [place];  Service: Orthopedics;  Laterality: Right;   RADIAL HEAD ARTHROPLASTY Left 11/20/2017   Procedure: ORIF VS RADIAL HEAD ARTHROPLASTY;  Surgeon: Shona Needles, MD;  Location: [place];  Service: Orthopedics;  Laterality: Left;    MEDICATIONS: No current facility-administered medications for this encounter.    Pseudoeph-Doxylamine-DM-APAP (NYQUIL PO)   hydrocortisone cream 1 %   Lidocaine 3 % CREA   methocarbamol (ROBAXIN-750) 750 MG tablet   oxyCODONE-acetaminophen (PERCOCET) 5-325 MG tablet   Randy Felix Ward, PA-C [facility] Pre-Surgical Testing [phone]

## 2022-11-26 NOTE — Progress Notes (Signed)
COVID Vaccine received:  []  No [x]  Yes Date of any COVID positive Test in last 90 days:  PCP - None Cardiologist - None  Chest x-ray -  EKG -  get at PST visit Stress Test -  ECHO -  Cardiac Cath -   PCR screen: []  Ordered & Completed                      []   No Order but Needs PROFEND                      [x]   N/A for this surgery  Surgery Plan:  [x]  Ambulatory                            []  Outpatient in bed                            []  Admit  Anesthesia:    [x]  General  []  Spinal                           []   Choice []   MAC  Bowel Prep - []  No  [x]   Yes  clear liquids/pureed foods the day before surgery.  MOM at 10 am and 2 pm the day before surgery.   Pacemaker / ICD device [x]  No []  Yes      Spinal Cord Stimulator:[x]  No []  Yes      Other Implants:   History of Sleep Apnea? [x]  No []  Yes   CPAP used?- [x]  No []  Yes    Does the patient monitor blood sugar? []  No []  Yes  [x]  N/A  Patient has: []  Pre-DM   []  DM1  []   DM2 Does patient have a Freestyle Libre or Dexacom? []  No []  Yes   Fasting Blood Sugar Ranges-  Checks Blood Sugar _____ times a day  Blood Thinner / Instructions: none Aspirin Instructions:  none  ERAS Protocol Ordered: []  No  []  Yes PRE-SURGERY []  ENSURE  []  G2  []  No Drink Ordered  Patient is to be NPO after:   Comments: Spanish interpreter has been scheduled to accompany the patient on DOS. Patient was given Spanish Consent to sign and Spanish Surgery instructions to use.   Activity level:   Can go up a flight of stairs and perform activities of daily living without stopping and without symptoms of chest pain or shortness of breath.  Able to exercise without symptoms    Anesthesia review: HTN, GERD, anemia  Patient denies shortness of breath, fever, cough and chest pain at PAT appointment.  Patient verbalized understanding and agreement to the Pre-Surgical Instructions that were given to them at this PAT appointment. Patient was also  educated of the need to review these PAT instructions again prior to his/her surgery.I reviewed the appropriate phone numbers to call if they have any and questions or concerns.

## 2022-11-26 NOTE — Patient Instructions (Addendum)
DEBIDO AL COVID-19 SLO SE PERMITEN DOS VISITANTES (de 16 aos en adelante)  PARA QUE VENGAN CON USTED Y SE QUEDEN EN LA SALA DE ESPERA SOLAMENTE DURANTE EL PRE OP Y EL PROCEDIMIENTO.   **NO SE PERMITEN VISITAS EN EL REA DE CORTA ESTADA NI EN LA SALA DE RECUPERACIN!!**  SI VA A SER INGRESADO(A) AL HOSPITAL SLO SE LE PERMITEN CUATRO PERSONAS DE APOYO DURANTE LAS HORAS DE VISITA (7 AM -8PM)   La(s) persona(s) de apoyo debe(n) pasar nuestra evaluacin, entrar y salir con gel y Teaching laboratory technician en todo momento, incluso en la habitacin del [place]. Los pacientes tambin deben usar una mscara cuando el personal o su persona de apoyo estn en la habitacin. Los visitantes DEBEN LLEVAR ETIQUETA DE VISITANTE DE UNA MANERA VISIBLE. Un visitante adulto International aid/development worker con usted durante la noche y DEBE estar en la habitacin a las 8 P.M.     Su procedimiento est programado en: Thursday, November 28, 2022   Presntese a la entrada [place]     Presntese a admisiones por la maana   Llame a este nmero si tiene problemas la maana de la Dwaine Gale al [phone]   INSTRUCCIONES DE PREPARACIN INTESTINAL para ciruga anal/rectal:  Consigue una botella de [place] de Magnesia en una farmacia.  DA PREVIO A LA CIRUGA:  Cambie a beber lquidos o alimentos en pur nicamente Beba muchos lquidos durante todo el da para evitar deshidratarse.  10:00 am: Tome 2 oz (4 cucharadas) de leche de magnesia.  2:00 pm: Tome 2 oz (4 cucharadas) de leche de magnesia.  Medianoche: No coma nada slido despus de acostarse (medianoche) la noche anterior a la ciruga. PERO beba muchos lquidos claros ([place]gu[place], Gatorade, [place], refrescos, caf, t, caldos, etc.)   Despus de la medianoche puede tomar los siguientes lquidos hasta la(s) _06:30_ AM DEL DA DE LA CIRUGA  Agua Caf negro (con azcar, SIN [place], NI CREMA)  T normal y descafeinado (con azcar, SIN LECHE, NI CREMA)                               Gelatina normal (NO ROJA)                                           Helados de frutas (sin pulpa. NO DE COLOR ROJO)                                     Helados de hielo (NO ROJO)                                                                  Jugo: de manzana, uva Mooar, arndano BLANCO Bebidas deportivas como Gatorade (NO ROJAS)         El da de la ciruga:  [employer] (1) Ensure transparente antes de la ciruga en LA [employer] de la ciruga. Tmeselo de un solo. No se lo tome de a sorbitos.  Esta bebida se le dio durante su cita preoperatoria en  el hospital. No tome nada ms despus de tomarse todo el Ensure transparente antes de la [place].          Si tiene preguntas, por favor. Pngase en contacto con la oficina de su cirujano.   SIGA LA PREPARACIN INTESTINAL Y CUALQUIER INSTRUCCIN ADICIONAL PREOPERATORIA QUE HAYA RECIBIDO DEL OFICINA DE DU CIRUJANO!!!     La higiene bucal tambin es importante para reducir el riesgo de infeccin.                                   Recuerde - LVESE LOS DIENTES EN LA MAANA DE LA CIRUGA CON SU PASTA DENTAL HABITUAL   NO fume despus de la medianoche   [employer] medicamentos en la maana de la ciruga con UN SORBO DE AGUA:  nunca  NO TOME [place] ORAL PARA LA DIABETES EL DA DE LA CIRUGA  Traiga la mascarilla CPAP y los tubos el da de la [place].                              No debe trae ningn metal en el cuerpo, incluyendo pinzas para el cabello, joyas, ni aretes/pendientes             No use maquillaje, lociones/cremas, polvos, perfumes/colonias o desodorante  No use esmalte de uas, incluyendo los de gel ni S&S, uas artificiales/acrlicas o cualquier otro tipo de cobertura en las uas naturales, Supreme y Kellogg. Si tiene uas artificiales, con capas de gel, etc. que necesite que le quiten en un saln de uas, por favor, pida que se lo quiten antes de la ciruga o la ciruga  podra ser cancelada/retrasada si el cirujano o el anestesilogo consideran que no puede ser monitoreado(a) de una forma segura.   No se rasure en las 48 horas antes de la operacin.               Los hombres pueden [employer] cara y el cuello.   No traiga objetos de valor al hospital.  NO SE HACE RESPONSABLE DE LOS OBJETOS DE VALOR.   Los contactos, las dentaduras o los puentes no se pueden usar durante la [place].   Sheela Stack una bolsa pequea para la noche el da de la [place].   NO TRAIGA AL HOSPITAL LOS MEDICAMENTOS QUE TOMA EN CASA . [place] EN SU LISTA [place]!    Los pacientes dados de alta el mismo da de la ciruga no podrn Air cabin crew a casa.  Es NECESARIO que alguien se quede con usted durante las primeras 24 horas despus de la anestesia.   Instrucciones especiales: Ane Payment copia de sus documentos de poder notarial y testamento vital el da de su ciruga si no los ha escaneado antes.                           PREPARACIN PARA [place]                                            Preparing for Surgery  Debido a que la piel no est esterilizada, sta necesita estar  lo ms libre de [employer] sea posible.  Usted puede reducir el nmero de grmenes en la piel lavndose con el jabn de CHG (Chlorahexidine gluconate) antes de la ciruga.  El CHG es un jabn antisptico el cual mata los grmenes y se une a la piel para continuar matando los grmenes incluso hasta despus de lavarse. POR FAVOR NO LO USE SI USTED TIENE ALERGIAS AL CHG.  SI LA PIEL SE IRRITA, DEJE DE USAR EL CHG.  NO SE RASURE DURANTE AL MENOS 12 HORAS ANTES DE LA PRIMERA DUCHA CON EL CHG. Siga estas instrucciones cuidadosamente:  Dchese la noche anterior a la [place] y de nuevo en la maana de la [place]. Si decide lavarse el cabello, lvelo con su champ normal primero. Enjuague el cabello y el cuerpo para quitarse  el [place]. Use el CHG como lo hara con cualquier otro jabn lquido, usando una toallita o esponja vegetal o exfoliante. Aplique el CHG al cuerpo solamente DEL CUELLO PARA ABAJO.  No lo use cerca de los ojos o los genitales. No se lave con su jabn normal despus de usar el CHG. Squese con una toalla limpia. Espere hasta la maana siguiente para aplicarse desodorantes, lociones, excepto en el da de la [place], NO SE APLIQUE LOCIONES. Use pijamas limpias o una bata. Coloque sbanas limpias en su cama la noche de su primera ducha - no duerma con mascotas. 10.  Use ropa limpia al venir al hospital.                     Por favor, lea las siguientes hojas informativas que le dieron: SI TIENE PREGUNTAS SOBRE SUS [place] [phone]

## 2022-11-27 ENCOUNTER — Encounter (HOSPITAL_COMMUNITY)
Admission: RE | Admit: 2022-11-27 | Discharge: 2022-11-27 | Disposition: A | Discharge status: Home / Self Care | Payer: 59 | Source: Ambulatory Visit | Attending: Surgery | Admitting: Surgery

## 2022-11-27 ENCOUNTER — Other Ambulatory Visit: Payer: Self-pay

## 2022-11-27 ENCOUNTER — Encounter (HOSPITAL_COMMUNITY): Payer: Self-pay

## 2022-11-27 DIAGNOSIS — I10 Essential (primary) hypertension: Secondary | ICD-10-CM | POA: Diagnosis not present

## 2022-11-27 DIAGNOSIS — Z01818 Encounter for other preprocedural examination: Secondary | ICD-10-CM | POA: Insufficient documentation

## 2022-11-27 DIAGNOSIS — D649 Anemia, unspecified: Secondary | ICD-10-CM | POA: Diagnosis not present

## 2022-11-27 LAB — CBC
HCT: 44.2 % (ref 39.0–52.0)
Hemoglobin: 14.3 g/dL (ref 13.0–17.0)
MCH: 29.6 pg (ref 26.0–34.0)
MCHC: 32.4 g/dL (ref 30.0–36.0)
MCV: 91.5 fL (ref 80.0–100.0)
Platelets: 218 10*3/uL (ref 150–400)
RBC: 4.83 MIL/uL (ref 4.22–5.81)
RDW: 14.1 % (ref 11.5–15.5)
WBC: 6.1 10*3/uL (ref 4.0–10.5)
nRBC: 0 % (ref 0.0–0.2)

## 2022-11-27 LAB — BASIC METABOLIC PANEL
Anion gap: 10 (ref 5–15)
BUN: 11 mg/dL (ref 6–20)
CO2: 21 mmol/L — ABNORMAL LOW (ref 22–32)
Calcium: 9.1 mg/dL (ref 8.9–10.3)
Chloride: 105 mmol/L (ref 98–111)
Creatinine, Ser: 0.92 mg/dL (ref 0.61–1.24)
GFR, Estimated: >60 mL/min (ref >60)
Glucose, Bld: 136 mg/dL — ABNORMAL HIGH (ref 70–99)
Potassium: 3.8 mmol/L (ref 3.5–5.1)
Sodium: 136 mmol/L (ref 135–145)

## 2022-11-28 ENCOUNTER — Encounter (HOSPITAL_COMMUNITY)
Admission: RE | Disposition: A | Discharge status: Home / Self Care | Payer: Self-pay | Source: Home / Self Care | Attending: Surgery

## 2022-11-28 ENCOUNTER — Ambulatory Visit (HOSPITAL_COMMUNITY): Payer: 59 | Admitting: Physician Assistant

## 2022-11-28 ENCOUNTER — Ambulatory Visit (HOSPITAL_COMMUNITY)
Admission: RE | Admit: 2022-11-28 | Discharge: 2022-11-28 | Disposition: A | Discharge status: Home / Self Care | Payer: 59 | Attending: Surgery | Admitting: Surgery

## 2022-11-28 ENCOUNTER — Ambulatory Visit (HOSPITAL_BASED_OUTPATIENT_CLINIC_OR_DEPARTMENT_OTHER): Payer: 59 | Admitting: Physician Assistant

## 2022-11-28 ENCOUNTER — Other Ambulatory Visit: Payer: Self-pay

## 2022-11-28 ENCOUNTER — Encounter (HOSPITAL_COMMUNITY): Payer: Self-pay | Admitting: Surgery

## 2022-11-28 DIAGNOSIS — Z87891 Personal history of nicotine dependence: Secondary | ICD-10-CM | POA: Insufficient documentation

## 2022-11-28 DIAGNOSIS — K219 Gastro-esophageal reflux disease without esophagitis: Secondary | ICD-10-CM | POA: Insufficient documentation

## 2022-11-28 DIAGNOSIS — D649 Anemia, unspecified: Secondary | ICD-10-CM

## 2022-11-28 DIAGNOSIS — Z09 Encounter for follow-up examination after completed treatment for conditions other than malignant neoplasm: Secondary | ICD-10-CM | POA: Diagnosis not present

## 2022-11-28 DIAGNOSIS — K643 Fourth degree hemorrhoids: Secondary | ICD-10-CM | POA: Insufficient documentation

## 2022-11-28 DIAGNOSIS — K449 Diaphragmatic hernia without obstruction or gangrene: Secondary | ICD-10-CM | POA: Diagnosis not present

## 2022-11-28 DIAGNOSIS — K649 Unspecified hemorrhoids: Secondary | ICD-10-CM

## 2022-11-28 DIAGNOSIS — I10 Essential (primary) hypertension: Secondary | ICD-10-CM | POA: Diagnosis not present

## 2022-11-28 DIAGNOSIS — K644 Residual hemorrhoidal skin tags: Secondary | ICD-10-CM | POA: Insufficient documentation

## 2022-11-28 HISTORY — DX: Essential (primary) hypertension: I10

## 2022-11-28 HISTORY — DX: Anemia, unspecified: D64.9

## 2022-11-28 HISTORY — PX: HEMORRHOID SURGERY: SHX153

## 2022-11-28 SURGERY — HEMORRHOIDECTOMY
Anesthesia: General

## 2022-11-28 MED ORDER — MEPERIDINE HCL 50 MG/ML IJ SOLN: 6.2500 mg | INTRAMUSCULAR | Status: DC | PRN

## 2022-11-28 MED ORDER — DEXAMETHASONE SODIUM PHOSPHATE 10 MG/ML IJ SOLN
INTRAMUSCULAR | Status: AC
  Filled 2022-11-28: qty 1

## 2022-11-28 MED ORDER — AMISULPRIDE (ANTIEMETIC) 5 MG/2ML IV SOLN
10.0000 mg | Freq: Once | INTRAVENOUS | Status: AC | PRN
  Administered 2022-11-28: 10 mg via INTRAVENOUS

## 2022-11-28 MED ORDER — EPHEDRINE 5 MG/ML INJ
INTRAVENOUS | Status: AC
  Filled 2022-11-28: qty 5

## 2022-11-28 MED ORDER — ROCURONIUM BROMIDE 10 MG/ML (PF) SYRINGE
PREFILLED_SYRINGE | INTRAVENOUS | Status: AC
  Filled 2022-11-28: qty 10

## 2022-11-28 MED ORDER — CELECOXIB 200 MG PO CAPS
200.0000 mg | ORAL_CAPSULE | ORAL | Status: AC
  Administered 2022-11-28: 200 mg via ORAL
  Filled 2022-11-28: qty 1

## 2022-11-28 MED ORDER — OXYCODONE HCL 5 MG/5ML PO SOLN: 5.0000 mg | Freq: Once | ORAL | Status: DC | PRN

## 2022-11-28 MED ORDER — LIDOCAINE 2% (20 MG/ML) 5 ML SYRINGE
INTRAMUSCULAR | Status: DC | PRN
  Administered 2022-11-28: 70 mg via INTRAVENOUS

## 2022-11-28 MED ORDER — DEXAMETHASONE SODIUM PHOSPHATE 10 MG/ML IJ SOLN
INTRAMUSCULAR | Status: DC | PRN
  Administered 2022-11-28: 4 mg via INTRAVENOUS

## 2022-11-28 MED ORDER — PROPOFOL 10 MG/ML IV BOLUS
INTRAVENOUS | Status: AC
  Filled 2022-11-28: qty 20

## 2022-11-28 MED ORDER — SODIUM CHLORIDE 0.9 % IV SOLN: 250.0000 mL | INTRAVENOUS | Status: DC | PRN

## 2022-11-28 MED ORDER — HYDROMORPHONE HCL 1 MG/ML IJ SOLN: 0.2500 mg | INTRAMUSCULAR | Status: DC | PRN

## 2022-11-28 MED ORDER — OXYCODONE HCL 5 MG PO TABS: 5.0000 mg | ORAL_TABLET | Freq: Once | ORAL | Status: DC | PRN

## 2022-11-28 MED ORDER — AMISULPRIDE (ANTIEMETIC) 5 MG/2ML IV SOLN
INTRAVENOUS | Status: AC
  Filled 2022-11-28: qty 4

## 2022-11-28 MED ORDER — MIDAZOLAM HCL 2 MG/2ML IJ SOLN
INTRAMUSCULAR | Status: DC | PRN
  Administered 2022-11-28: 2 mg via INTRAVENOUS

## 2022-11-28 MED ORDER — SODIUM CHLORIDE 0.9% FLUSH: 3.0000 mL | INTRAVENOUS | Status: DC | PRN

## 2022-11-28 MED ORDER — ROCURONIUM BROMIDE 10 MG/ML (PF) SYRINGE
PREFILLED_SYRINGE | INTRAVENOUS | Status: DC | PRN
  Administered 2022-11-28: 40 mg via INTRAVENOUS

## 2022-11-28 MED ORDER — ACETAMINOPHEN 500 MG PO TABS
1000.0000 mg | ORAL_TABLET | ORAL | Status: AC
  Administered 2022-11-28: 1000 mg via ORAL
  Filled 2022-11-28: qty 2

## 2022-11-28 MED ORDER — ONDANSETRON HCL 4 MG/2ML IJ SOLN
INTRAMUSCULAR | Status: DC | PRN
  Administered 2022-11-28: 4 mg via INTRAVENOUS

## 2022-11-28 MED ORDER — GLYCOPYRROLATE 0.2 MG/ML IJ SOLN: INTRAMUSCULAR | Status: DC | PRN

## 2022-11-28 MED ORDER — BUPIVACAINE LIPOSOME 1.3 % IJ SUSP
INTRAMUSCULAR | Status: AC
  Filled 2022-11-28: qty 20

## 2022-11-28 MED ORDER — EPHEDRINE SULFATE-NACL 50-0.9 MG/10ML-% IV SOSY
PREFILLED_SYRINGE | INTRAVENOUS | Status: DC | PRN
  Administered 2022-11-28 (×2): 10 mg via INTRAVENOUS

## 2022-11-28 MED ORDER — DIBUCAINE (PERIANAL) 1 % EX OINT
TOPICAL_OINTMENT | CUTANEOUS | Status: DC | PRN
  Administered 2022-11-28: 1 via RECTAL

## 2022-11-28 MED ORDER — LIDOCAINE HCL (PF) 2 % IJ SOLN
INTRAMUSCULAR | Status: AC
  Filled 2022-11-28: qty 5

## 2022-11-28 MED ORDER — DIAZEPAM 5 MG PO TABS: 5.0000 mg | ORAL_TABLET | Freq: Three times a day (TID) | ORAL | 1 refills | Status: AC | PRN

## 2022-11-28 MED ORDER — SODIUM CHLORIDE 0.9% FLUSH: 3.0000 mL | Freq: Two times a day (BID) | INTRAVENOUS | Status: DC

## 2022-11-28 MED ORDER — CHLORHEXIDINE GLUCONATE CLOTH 2 % EX PADS: 6.0000 | MEDICATED_PAD | Freq: Once | CUTANEOUS | Status: DC

## 2022-11-28 MED ORDER — FENTANYL CITRATE (PF) 100 MCG/2ML IJ SOLN
INTRAMUSCULAR | Status: DC | PRN
  Administered 2022-11-28: 100 ug via INTRAVENOUS

## 2022-11-28 MED ORDER — LACTATED RINGERS IV SOLN: INTRAVENOUS | Status: DC

## 2022-11-28 MED ORDER — ORAL CARE MOUTH RINSE: 15.0000 mL | Freq: Once | OROMUCOSAL | Status: AC

## 2022-11-28 MED ORDER — SODIUM CHLORIDE 0.9 % IV SOLN: INTRAVENOUS | Status: DC

## 2022-11-28 MED ORDER — PROPOFOL 10 MG/ML IV BOLUS
INTRAVENOUS | Status: DC | PRN
  Administered 2022-11-28: 110 mg via INTRAVENOUS

## 2022-11-28 MED ORDER — MIDAZOLAM HCL 2 MG/2ML IJ SOLN
INTRAMUSCULAR | Status: AC
  Filled 2022-11-28: qty 2

## 2022-11-28 MED ORDER — PHENYLEPHRINE 80 MCG/ML (10ML) SYRINGE FOR IV PUSH (FOR BLOOD PRESSURE SUPPORT)
PREFILLED_SYRINGE | INTRAVENOUS | Status: DC | PRN
  Administered 2022-11-28: 80 ug via INTRAVENOUS

## 2022-11-28 MED ORDER — ONDANSETRON HCL 4 MG/2ML IJ SOLN
INTRAMUSCULAR | Status: AC
  Filled 2022-11-28: qty 2

## 2022-11-28 MED ORDER — GABAPENTIN 300 MG PO CAPS
300.0000 mg | ORAL_CAPSULE | ORAL | Status: AC
  Administered 2022-11-28: 300 mg via ORAL
  Filled 2022-11-28: qty 1

## 2022-11-28 MED ORDER — ENSURE PRE-SURGERY PO LIQD: 296.0000 mL | Freq: Once | ORAL | Status: DC

## 2022-11-28 MED ORDER — 0.9 % SODIUM CHLORIDE (POUR BTL) OPTIME
TOPICAL | Status: DC | PRN
  Administered 2022-11-28: 1000 mL

## 2022-11-28 MED ORDER — SUGAMMADEX SODIUM 200 MG/2ML IV SOLN
INTRAVENOUS | Status: DC | PRN
  Administered 2022-11-28: 200 mg via INTRAVENOUS

## 2022-11-28 MED ORDER — PROMETHAZINE HCL 25 MG/ML IJ SOLN: 6.2500 mg | INTRAMUSCULAR | Status: DC | PRN

## 2022-11-28 MED ORDER — BUPIVACAINE LIPOSOME 1.3 % IJ SUSP: 20.0000 mL | Freq: Once | INTRAMUSCULAR | Status: DC

## 2022-11-28 MED ORDER — OXYCODONE HCL 5 MG PO TABS: 5.0000 mg | ORAL_TABLET | Freq: Four times a day (QID) | ORAL | 0 refills | Status: AC | PRN

## 2022-11-28 MED ORDER — BUPIVACAINE-EPINEPHRINE (PF) 0.25% -1:200000 IJ SOLN
INTRAMUSCULAR | Status: AC
  Filled 2022-11-28: qty 30

## 2022-11-28 MED ORDER — FENTANYL CITRATE (PF) 100 MCG/2ML IJ SOLN
INTRAMUSCULAR | Status: AC
  Filled 2022-11-28: qty 2

## 2022-11-28 MED ORDER — CHLORHEXIDINE GLUCONATE 0.12 % MT SOLN
15.0000 mL | Freq: Once | OROMUCOSAL | Status: AC
  Administered 2022-11-28: 15 mL via OROMUCOSAL

## 2022-11-28 MED ORDER — DIAZEPAM 2 MG PO TABS: 5.0000 mg | ORAL_TABLET | Freq: Four times a day (QID) | ORAL | Status: DC | PRN

## 2022-11-28 MED ORDER — PHENYLEPHRINE 80 MCG/ML (10ML) SYRINGE FOR IV PUSH (FOR BLOOD PRESSURE SUPPORT)
PREFILLED_SYRINGE | INTRAVENOUS | Status: AC
  Filled 2022-11-28: qty 10

## 2022-11-28 MED ORDER — BUPIVACAINE-EPINEPHRINE (PF) 0.25% -1:200000 IJ SOLN
INTRAMUSCULAR | Status: DC | PRN
  Administered 2022-11-28: 40 mL

## 2022-11-28 MED ORDER — SODIUM CHLORIDE 0.9 % IV SOLN
2.0000 g | INTRAVENOUS | Status: AC
  Administered 2022-11-28: 2 g via INTRAVENOUS
  Filled 2022-11-28: qty 20

## 2022-11-28 MED ORDER — DIBUCAINE (PERIANAL) 1 % EX OINT
TOPICAL_OINTMENT | CUTANEOUS | Status: AC
  Filled 2022-11-28: qty 28

## 2022-11-28 SURGICAL SUPPLY — 40 items
APL SKNCLS STERI-STRIP NONHPOA (GAUZE/BANDAGES/DRESSINGS) ×1
BAG COUNTER SPONGE SURGICOUNT (BAG) IMPLANT
BAG SPNG CNTER NS LX DISP (BAG)
BENZOIN TINCTURE PRP APPL 2/3 (GAUZE/BANDAGES/DRESSINGS) ×1 IMPLANT
BLADE SURG 15 STRL LF DISP TIS (BLADE) IMPLANT
BLADE SURG 15 STRL SS (BLADE)
BRIEF MESH DISP LRG (UNDERPADS AND DIAPERS) ×1 IMPLANT
CNTNR URN SCR LID CUP LEK RST (MISCELLANEOUS) ×1 IMPLANT
CONT SPEC 4OZ STRL OR WHT (MISCELLANEOUS) ×1
COVER SURGICAL LIGHT HANDLE (MISCELLANEOUS) ×1 IMPLANT
DRAPE LAPAROTOMY T 102X78X121 (DRAPES) ×1 IMPLANT
ELECT REM PT RETURN 15FT ADLT (MISCELLANEOUS) ×1 IMPLANT
GAUZE 4X4 16PLY ~~LOC~~+RFID DBL (SPONGE) ×1 IMPLANT
GAUZE PAD ABD 8X10 STRL (GAUZE/BANDAGES/DRESSINGS) IMPLANT
GAUZE SPONGE 4X4 12PLY STRL (GAUZE/BANDAGES/DRESSINGS) IMPLANT
GLOVE ECLIPSE 8.0 STRL XLNG CF (GLOVE) ×1 IMPLANT
GLOVE INDICATOR 8.0 STRL GRN (GLOVE) ×1 IMPLANT
GOWN STRL REUS W/ TWL XL LVL3 (GOWN DISPOSABLE) ×3 IMPLANT
GOWN STRL REUS W/TWL XL LVL3 (GOWN DISPOSABLE) ×3
KIT BASIN OR (CUSTOM PROCEDURE TRAY) ×1 IMPLANT
KIT TURNOVER KIT A (KITS) IMPLANT
LOOP VESSEL MAXI BLUE (MISCELLANEOUS) IMPLANT
NDL HYPO 22X1.5 SAFETY MO (MISCELLANEOUS) ×1 IMPLANT
NEEDLE HYPO 22X1.5 SAFETY MO (MISCELLANEOUS) ×1 IMPLANT
PACK BASIC VI WITH GOWN DISP (CUSTOM PROCEDURE TRAY) ×1 IMPLANT
PENCIL SMOKE EVACUATOR (MISCELLANEOUS) IMPLANT
SCRUB CHG 4% DYNA-HEX 4OZ (MISCELLANEOUS) ×1 IMPLANT
SHEARS HARMONIC 9CM CVD (BLADE) IMPLANT
SPIKE FLUID TRANSFER (MISCELLANEOUS) ×1 IMPLANT
SURGILUBE 2OZ TUBE FLIPTOP (MISCELLANEOUS) ×1 IMPLANT
SUT CHROMIC 2 0 SH (SUTURE) ×1 IMPLANT
SUT CHROMIC 3 0 SH 27 (SUTURE) IMPLANT
SUT VIC AB 2-0 SH 27 (SUTURE)
SUT VIC AB 2-0 SH 27X BRD (SUTURE) IMPLANT
SUT VIC AB 2-0 UR6 27 (SUTURE) ×6 IMPLANT
SYR 20ML LL LF (SYRINGE) ×1 IMPLANT
SYR 3ML LL SCALE MARK (SYRINGE) IMPLANT
TOWEL OR 17X26 10 PK STRL BLUE (TOWEL DISPOSABLE) ×1 IMPLANT
TOWEL OR NON WOVEN STRL DISP B (DISPOSABLE) ×1 IMPLANT
YANKAUER SUCT BULB TIP 10FT TU (MISCELLANEOUS) ×1 IMPLANT

## 2022-11-28 NOTE — Op Note (Signed)
11/28/2022  10:48 AM  PATIENT:  Randy Barker  57 y.o. male  Patient Care Team: Patient, No Pcp Per as PCP - General (General Practice) Haddix, Thomasene Lot, MD as Consulting Physician (Orthopedic Surgery) Ralene Ok, MD as Consulting Physician (General Surgery) Michael Boston, MD as Consulting Physician (Colon and Rectal Surgery)  PRE-OPERATIVE DIAGNOSIS:  HEMORRHOID - EXTERNAL and HEMORRHOID - INTERNAL GRADE 4 PROLAPSED  POST-OPERATIVE DIAGNOSIS:  HEMORRHOID - EXTERNAL, HEMORRHOID - INTERNAL GRADE 4 PROLAPSED, and HEMORRHOID - INTERNAL GRADE 3 PROLAPSING  PROCEDURE: HEMORRHOIDAL LIGATION & HEMORRHOIDOPEXY and HEMORRHOIDECTOMY x 2  SURGEON:  Adin Hector, MD  ASSISTANT:     ANESTHESIA:   General endotracheal intubation anesthesia (GETA) and Anorectal and field block for perioperative & postoperative pain control provided with liposomal bupivacaine (Experel) 85mL mixed with 70mL of bupivicaine 0.25% with epinephrine  Estimated Blood Loss (EBL):   Total I/O In: 100 [IV Piggyback:100] Out: - .   (See anesthesia record)  Delay start of Pharmacological VTE agent (>24hrs) due to concerns of significant anemia, surgical blood loss, or risk of bleeding?:  no  DRAINS: (None)  SPECIMEN:  Hemorrhoid: Right anterior and Right posterior  DISPOSITION OF SPECIMEN:  Pathology  COUNTS:  Sponge, needle, & instrument counts CORRECT  PLAN OF CARE: Discharge to home after PACU  PATIENT DISPOSITION:  PACU - hemodynamically stable.  INDICATION: Pleasant patient with struggles with hemorrhoids.  Not able to be managed in the office despite an improved bowel regimen.  I recommended  examination under anesthesia and surgical treatment:  The anatomy & physiology of the anorectal region was discussed.  The pathophysiology of hemorrhoids and differential diagnosis was discussed.  Natural history risks without surgery was discussed.   I stressed the importance of a bowel regimen to have daily soft  bowel movements to minimize progression of disease.  Interventions such as sclerotherapy & banding were discussed.  The patient's symptoms are not adequately controlled by medicines and other non-operative treatments.  I feel the risks & problems of no surgery outweigh the operative risks; therefore, I recommended surgery to treat the hemorrhoids by ligation, pexy, and possible resection.  Risks such as bleeding, infection, need for further treatment, heart attack, death, and other risks were discussed.   I noted a good likelihood this will help address the problem.  Goals of post-operative recovery were discussed as well.  Possibility that this will not correct all symptoms was explained.  Post-operative pain, bleeding, constipation, urinary difficulties, and other problems after surgery were discussed.  We will work to minimize complications.   Educational handouts further explaining the pathology, treatment options, and bowel regimen were given as well.  Questions were answered.  The patient expresses understanding & wishes to proceed with surgery.  OR FINDINGS: Right anterior grade 4 hemorrhoid with external component.  Ligation/pexy done.  Grade 2/3 internal hemorrhoids especially left anterior and right posterior.  Small residual right posterior external hemorrhoidal tag trimmed.  No fissure, fistula, abscess, rectal mass, condyloma, pilonidal disease.  DESCRIPTION:   Informed consent was confirmed. Patient underwent general anesthesia without difficulty. Patient was placed into  prone/jacknife positioning.  The perianal region was prepped and draped in sterile fashion. Surgical time-out confirmed our plan.  I did digital rectal examination and then transitioned over to anoscopy to get a sense of the anatomy.  Findings noted above.   I proceeded to do hemorrhoidal ligation and pexy using a large self-retaining Parks retractor & occasionally alternating with a large Training and development officer.  I  used a 2-0 Vicryl suture on a UR-6 needle in a figure-of-eight fashion 6 cm proximal to the anal verge.  I started at the largest hemorrhoid pile = right anterior.  Because of redundant hemorrhoidal tissue too bulky to merely ligate or pexy, I excised the excess internal hemorrhoid piles longitudinally in a fusiform biconcave fashion, sparing the anal canal to avoid narrowing.  I then ran that stitch longitudinally more distally to close the hemorrhoidectomy wound to the anal verge over a large Parks self-retaining anal retractor to avoid narrowing of the anal canal.  I then tied that stitch down to cause a hemorrhoidopexy.   I then did hemorrhoidal ligation and pexy at the other 5 hemorrhoidal columns.  At the completion of this, all 6 anorectal columns were ligated and pexied in the classic hexagonal fashion (right anterior/lateral/posterior, left anterior/lateral/posterior).    I redid anoscopy and examination.  Hemostasis was good.  Patient had persistent right posterior external hemorrhoidal tissue.  I radially trimmed and closed closed the external part of the hemorrhoidectomy wounds at the right anterior and right posterior locations with radial interrupted horizontal mattress 2-0 chromic suture over a large Sawyer anal retractor, leaving the last 5 mm open to allow natural drainage.  I repeated anoscopy & examination.  At completion of this, all hemorrhoids had been trimmed down and/or reduced into the rectum.  There is no more prolapse.  Internal & external anatomy was more more normal.  Hemostasis was good.  Fluffed gauze was on-laid over the perianal region.  No packing done.  Patient is being extubated go to go to the recovery room.  I had discussed postop care in detail with the patient in the preop holding area the patient and his brother, [name], with the help of a bedside Spanish interpreter.  Allowed plenty time for questions.  Instructions for post-operative recovery and prescriptions are  written. I discussed operative findings, updated the patient's status, discussed probable steps to recovery, and gave postoperative recommendations to the patient's brother, Starleen Blue  with an interpreter.  Recommendations were made.  Questions were answered.  He expressed understanding & appreciation.  Adin Hector, M.D., F.A.C.S. Gastrointestinal and Minimally Invasive Surgery [facility] [address], [place], [place] [address] [phone] Main / Paging

## 2022-11-28 NOTE — Transfer of Care (Signed)
Immediate Anesthesia Transfer of Care Note  Patient: Randy Barker  Procedure(s) Performed: HEMORRHOIDECTOMY WITH LIGATION AND HEMORRHOIDOPEXY EXAM UNDER ANESTHESIA  Patient Location: PACU  Anesthesia Type:General  Level of Consciousness: drowsy  Airway & Oxygen Therapy: Patient Spontanous Breathing and Patient connected to face mask oxygen  Post-op Assessment: Report given to RN, Post -op Vital signs reviewed and stable, and Patient moving all extremities X 4  Post vital signs: Reviewed and stable  Last Vitals:  Vitals Value Taken Time  BP 151/75   Temp    Pulse 83 11/28/22 1100  Resp 22 11/28/22 1100  SpO2 99 % 11/28/22 1100  Vitals shown include unvalidated device data.  Last Pain:  Vitals:   11/28/22 0750  TempSrc:   PainSc: 0-No pain         Complications: No notable events documented.

## 2022-11-28 NOTE — Discharge Instructions (Addendum)
CIRUGA ANORRECTAL: INSTRUCCIONES POSTERIORES A LA OPERACIN  ################################################################################################################################################################################################################################################################### ####################  COMER Comience con una dieta lquida completa o en pur Despus de 24 horas, realice una transicin gradual a una Horticulturist, commercial.  CONTROL DEL DOLOR Controle el dolor para que pueda tolerar las deposiciones. caminar, dormir, tolerar los estornudos / toser y Associate Professor / Medical illustrator.   TENGA UN MOVIMIENTO INTESTINAL DIARIAMENTE Mantenga sus intestinos regulares para evitar problemas. Tomar un suplemento de [employer] para [employer] intestinos [place]. Pruebe un laxante para combatir el estreimiento. Utilice un antidiarreico para Insurance risk surveyor. Llame si no mejora despus de 2 intentos  CAMINAR [place] una hora al da. Controle su dolor para hacer eso.   LLAME SI TIENE PROBLEMAS / INQUIETUDES Llame si todava tiene dificultades a pesar de seguir estas instrucciones. Llame si tiene inquietudes que no responden a estas instrucciones  ################################################################################################################################################################################################################################################################### ####################    Tome sus medicamentos caseros recetados habitualmente a menos que se le indique lo contrario.  DIETA: Siga una dieta ligera y blanda y [place] primeras 24 horas despus de llegar a casa, como sopa, lquidos, almidones, etc. Asegrese de beber muchos lquidos. Avance rpidamente a una dieta slida habitual en unos [employer]. Evite la comida rpida o las comidas pesadas, ya que es ms probable que sienta nuseas o  tenga intestinos irregulares. Una dieta baja en grasas y alta en fibra por el resto de su vida es ideal.  CONTROL DE DOLOR: El dolor se controla mejor mediante una combinacin habitual de tres mtodos diferentes JUNTOS: Hielo / Calor Analgsicos de venta libre Analgsicos recetados Espere hinchazn y Tree surgeon en el rea del ano / recto. Los baos de agua tibia (de 30 a 60 minutos hasta 6 veces al da, especialmente despus de los movimientos intestinales) ayudarn. Use hielo durante los primeros das para ayudar a disminuir la hinchazn y los moretones, luego cambie a Freight forwarder como toallas tibias, baos de [place], baos tibios, etc. para ayudar a relajar los puntos tensos / doloridos y Engineer, water. Algunas Presenter, broadcasting solo, solo calor, alternando entre hielo y Freight forwarder. Experimente con lo que le funcione. Es til Radio producer un analgsico de venta libre de forma continua durante las primeras semanas. Elija uno de los siguientes que funcione mejor para usted: Naproxeno (Aleve, etc.) Dos tabletas de 220 mg [employer] al da Ibuprofeno (Advil, etc.) Tres tabletas de 200 mg cuatro veces al da (cada comida y antes de Acupuncturist) Acetaminofn (Tylenol, etc.) 500-650 mg cuatro veces al da (cada comida y antes de Acupuncturist) Se le debe dar una receta para analgsicos (como oxicodona, hidrocodona, etc.) al momento del alta. Tome sus analgsicos segn lo prescrito. Si tiene problemas / inquietudes con el medicamento recetado (no controla el dolor, las nuseas, los vmitos, el sarpullido, la picazn, etc.), llmenos al [phone] para ver si necesitamos cambiarlo a otro medicamento para Conservation officer, historic buildings. que funcionar mejor para usted y / o Chief Technology Officer mejor sus efectos secundarios. Si necesita reabastecer su medicamento para el dolor, comunquese con su farmacia. Ellos se comunicarn con nuestra oficina para solicitar autorizacin. Las recetas no se surtirn despus de las 5 pm o los fines de  [place]. Puede tomar hasta 48 horas para que se llene y est listo, as que evite esperar hasta que est listo para tomar la ltima pldora. Se le puede dar una crema tpica (Dibucaine) o una receta para una crema (como diltiazem 2% gel). Muchas personas encuentran alivio con las cremas tpicas. A  algunas personas les [employer]. Experimentar. Si te ayuda, salo. Si se quema, no lo use.  Use un bao de asiento 4-8 veces al da para aliviar   Bao de asiento Un bao de asiento es un bao de agua tibia que se toma en la posicin sentada y que cubre solo las caderas y las nalgas. Puede utilizarse con fines curativos o higinicos. Los baos de asiento tambin se [place] para Best boy, la picazn o los espasmos musculares. El agua puede contener medicamentos. El calor hmedo te ayudar a curarte y relajarte. [place] 3 a 4 baos de asiento [employer]. Llene la baera hasta la mitad con agua tibia. Sintese en el agua y abra un poco el desage. Abra el agua tibia para Consulting civil engineer tina medio llena. Mantenga el agua corriendo constantemente. Remojar en el agua durante 15 a 20 minutos. Despus del bao de asiento, seque primero el rea afectada con palmaditas.   [place] objetivo es una evacuacin intestinal blanda al da. Evite el estreimiento. Entre la [place] y los analgsicos, es comn experimentar algo de estreimiento. Aumentar la ingesta de lquidos y tomar un suplemento de fibra (como Metamucil, [place], [place], [place], etc.) 2-3 veces al da con regularidad generalmente ayudar a prevenir que ocurra este problema. Se debe tomar un laxante suave (jugo de [place], [place] de magnesia, [place], etc.) de acuerdo con las instrucciones del paquete si no hay evacuaciones intestinales despus de 48 horas. Cuidado con la diarrea. Si tiene muchas deposiciones blandas, simplifique su dieta a alimentos y lquidos blandos durante unos  das. Detenga los ablandadores de heces y [place] su suplemento de [place]. Cambiar a medicamentos antidiarreicos leves (Kayopectate, Pepto Bismol) puede ayudar. Puede probar una dosis de imodio / loperamida. Si esto empeora o no mejora, por favor llmenos.  Cuidado de Al[employer]a Inc vendajes con su primera evacuacin intestinal, generalmente el da despus de la ciruga. Es posible que tenga un taponamiento si tuvo un absceso. Deje que se salga cualquier empaque o gasa que caiga. Use una almohadilla absorbente o bolas de algodn suave en su ropa interior segn sea necesario para atrapar cualquier drenaje y ayudar a Theatre manager el rea Mantenga el rea limpia y [place]. Bese / dchese [employer]. Mantenga el rea limpia duchndose / bandose sobre la incisin / herida. Est bien remojar una herida abierta para ayudar a lavarla. Considere usar una botella exprimible llena de agua tibia para lavar suavemente el rea anal. Las toallitas hmedas o las duchas / el lavado suave despus de defecar suelen ser menos traumticas que el papel higinico normal. A menudo notar sangrado con las deposiciones. Esto debera disminuir al final de la primera semana de la [place]. Sentarse sobre una bolsa de hielo puede ayudar. Espere algo de drenaje. Esto debera disminuir al final de la primera semana de la ciruga, pero tendr sangrado o supuracin ocasional hasta unos meses despus de la ciruga. Use una gasa absorbente o de algodn [place] en su ropa interior hasta que deje de supurar.  ACTIVIDADES segn lo tolerado: Puede reanudar las actividades diarias regulares (ligeras) a Proofreader del da siguiente, como el cuidado personal diario, Writer, subir escaleras, aumentando gradualmente las actividades segn las [place]. Si puede caminar 30 minutos sin dificultad, es seguro probar actividades ms intensas como trotar, andar en cinta, andar en bicicleta, aerbicos de bajo impacto, nadar, etc. Guarde la actividad ms  intensa y extenuante para el final, como abdominales,  levantar objetos pesados, deportes de contacto, etc. Abstenerse de levantar objetos pesados o hacer esfuerzos hasta que deje de tomar narcticos para Financial controller. NO EMPUJE EL DOLOR. Deje que el dolor sea su gua: si le duele hacer algo, no lo haga. El dolor es su cuerpo que le advierte que evite esa actividad durante otra semana hasta que el dolor disminuya. Puede conducir cuando ya no est tomando analgsicos recetados, puede sentarse cmodamente durante largos perodos de tiempo y Warehouse manager con seguridad su automvil y Midwife los frenos. Puede tener relaciones sexuales cuando sea cmodo. SEGUIMIENTO en nuestra oficina Llame a CCS al ([phone] para programar una cita para ver a su cirujano en el consultorio para una cita de seguimiento aproximadamente 2-3 semanas despus de su ciruga. Asegrese de llamar para esta cita el da que llegue a casa para asegurar una hora conveniente para la cita.  [place] FORMULARIOS, LLEVELOS A LA OFICINA PARA SU PROCESAMIENTO. NO SE LOS D A SU MDICO.        CUNDO LLAMARNOS ([phone]: Mal control del dolor Reacciones / problemas con nuevos medicamentos (sarpullido / picazn, nuseas, etc.) Fiebre superior a 101,5 F (38,5 C) Incapacidad para orinar Nuseas y / o vmitos Empeoramiento de la hinchazn o hematomas Sangrado continuo de la incisin. Mayor [employer] Corporation, enrojecimiento o supuracin de la incisin  El personal de la clnica est disponible para responder sus preguntas durante el horario laboral habitual (de 8:30 a. M. A 5 p. M.). No dude en llamar y pedir hablar con una de nuestras enfermeras si tiene inquietudes clnicas. [facility] siempre est disponible en los hospitales.  Si tiene [employer], vaya a la sala de emergencias ms cercana o llame al 911.   [place],  [place], [place], [place], [place] [address]? PRINCIPAL: ([phone]? LLAMADA GRATUITADY:[id number]? FAX (336) [phone] [email or web address]

## 2022-11-28 NOTE — H&P (Signed)
11/28/2022   REFERRING PHYSICIAN: Algis Greenhouse*  Patient Care Team: Unknown as PCP - General  PROVIDER: Hollace Kinnier, MD  [facility] MRN: [id number] DOB: 1965-09-19  SUBJECTIVE  Chief Complaint: Hemorrhoids   Randy Barker is a 57 y.o. male who is seen today as an office consultation at the request of PA. Erskine Speed for evaluation of anal pain  History of Present Illness:  57 year old gentleman. Speaks Spanish only. We used an interpreter via telephone system. Patient claims to have had anal discomfort for many years. Prior at least the past decade. Recalls being told he had hemorrhoids. He had episode of more sharp pain and swelling. Worsening perianal hygiene. Frustrating. Concerning. Went to emergency department at [facility] 12/5. Irritated external hemorrhoid suspect to be the source of his problems. Recommendation made for surgery follow-up. Prescriptions for hydrocortisone and lidocaine cream given. Seen Dr. Rosendo Gros in our group for lap BIH hernia repairs in 2014. He comes in 2 weeks later.  Patient is a [employer] conservation officer. His elbow fracture 4 years ago, he has had to do more light moderate work about only 3 days a week. Although no chest pain or exertional pain. He has had to have some hernia and joint surgeries for an elbow fracture in the past. Does not look like he has a regular doctor. Has never had a colonoscopy. He does not smoke. No diabetes that he is aware of. Not on any medications. Claims he can walk at least 1/2-hour without difficulty. He was diagnosed with a hiatal hernia on a CAT scan a decade ago. He denies much in the way of heartburn or reflux. He has not used NSAIDs very often. No aspirin. Denies any history of ulcers. Does not recall any family history of any colorectal issues such as inflammatory bowel disease or cancer. No dysphagia to solids or liquids. No heartburn supine or bending over.  Medical History:  Past Medical History: Diagnosis  Date GERD (gastroesophageal reflux disease)  Patient Active Problem List Diagnosis Anal pain Non-English speaking patient Prolapsed internal hemorrhoids, grade 3 External hemorrhoids with complication Melena Hiatal hernia  Past Surgical History: Procedure Laterality Date HERNIA REPAIR JOINT REPLACEMENT   No Known Allergies  Current Outpatient Medications on File Prior to Visit Medication Sig Dispense Refill hydrocortisone 1 % cream Apply topically 2 (two) times daily lidocaine 3 % Crea Apply topically  No current facility-administered medications on file prior to visit.  History reviewed. No pertinent family history.  Social History  Tobacco Use Smoking Status Former Types: Cigarettes Quit date: 2011 Years since quitting: 12.9 Smokeless Tobacco Never   Social History  Socioeconomic History Marital status: Single Tobacco Use Smoking status: Former Types: Cigarettes Quit date: 2011 Years since quitting: 12.9 Smokeless tobacco: Never Substance and Sexual Activity Alcohol use: Not Currently Drug use: Not Currently  ############################################################  Review of Systems: A complete review of systems (ROS) was obtained from the patient. We have reviewed this information and discussed as appropriate with the patient. See HPI as well for other pertinent ROS.  Constitutional: No fevers, chills, sweats. Weight stable Eyes: No vision changes, No discharge HENT: No sore throats, nasal drainage Lymph: No neck swelling, No bruising easily Pulmonary: No cough, productive sputum CV: No orthopnea, PND . No exertional chest/neck/shoulder/arm pain. Patient can walk 30 minutes without difficulty.  GI: No personal nor family history of GI/colon cancer, inflammatory bowel disease, irritable bowel syndrome, allergy such as Celiac Sprue, dietary/dairy problems, colitis, ulcers nor gastritis. No recent sick contacts/gastroenteritis. No travel  outside  the country. No changes in diet.  Renal: No UTIs, No hematuria Genital: No drainage, bleeding, masses Musculoskeletal: No severe joint pain except for decreased function of right upper extremity due to severe elbow fracture 2019 good ROM major joints Skin: No sores or lesions Heme/Lymph: No easy bleeding. No swollen lymph nodes Neuro: No active seizures. No facial droop Psych: No hallucinations. No agitation  OBJECTIVE  Vitals: 08/27/22 0852 BP: 132/82 Pulse: 80 Weight: 68.5 kg (151 lb) Height: 165.1 cm (5\' 5" )  Body mass index is 25.13 kg/m.  PHYSICAL EXAM:  Constitutional: Not cachectic. Hygeine adequate. Vitals signs as above. Eyes: No glasses. Vision adequate,Pupils reactive, normal extraocular movements. Sclera nonicteric Neuro: CN II-XII intact. No major focal sensory defects. No major motor deficits. Lymph: No head/neck/groin lymphadenopathy Psych: No severe agitation. No severe anxiety. Judgment & insight Adequate, Oriented x4, HENT: Normocephalic, Mucus membranes moist. No thrush. Hearing: adequate Neck: Supple, No tracheal deviation. No obvious thyromegaly Chest: No pain to chest wall compression. Good respiratory excursion. No audible wheezing CV: Pulses intact. regular. No major extremity edema Ext: No obvious deformity or contracture. Edema: Not present. No cyanosis Skin: No major subcutaneous nodules. Warm and dry Musculoskeletal: Severe joint rigidity not present. No obvious clubbing. No digital petechiae. Mobility: no assist device moving easily without restrictions  Abdomen: Flat Soft. Nondistended. Nontender. Hernia: Not present. Diastasis recti: Not present. No hepatomegaly. No splenomegaly.  Genital/Pelvic: Inguinal hernia: Not present. Inguinal lymph nodes: without lymphadenopathy nor hidradenitis.  Rectal: Perianal skin Clean with good hygiene Pruritis ani: Mild Pilonidal disease: Not present Condyloma / warts: Not present  Anal fissure: Not  present Perirectal abscess/fistula Not present External hemorrhoids Present at: Right greater than left. Numerous tags.  Digital and anoscopic rectal exam tolerated Sphincter tone Increased Hemorrhoidal piles large internal hemorrhoids. Right anterior thickened at least grade 3. Correlates with larger external hemorrhoid. Prostate: Normal size & no nodularity Rectovaginal septum: N/A Rectal masses: Not present  Other significant findings: N/A  Patient examined with patient in decubitus position . ###################################    ###################################################################  Labs, Imaging and Diagnostic Testing:  Located in 'Care Everywhere' section of Epic EMR chart  PRIOR [facility] CLINIC NOTES:  Located in [place]' section of Epic EMR chart  SURGERY NOTES:  Located in [place]' section of Epic EMR chart  PATHOLOGY:  Not applicable  Assessment and Plan: DIAGNOSES:  Diagnoses and all orders for this visit:  External hemorrhoids with complication  Anal pain  Non-English speaking patient  Melena  Prolapsed internal hemorrhoids, grade 3  Hiatal hernia    ASSESSMENT/PLAN  Significant inflamed internal hemorrhoids and external hemorrhoids with worsening episodes of bleeding and discomfort.  I think he would benefit from hemorrhoid ligation & pexy and hemorrhoidectomy. Outpatient surgery. The anatomy & physiology of the anorectal region was discussed. The pathophysiology of hemorrhoids and differential diagnosis was discussed. Natural history risks without surgery was discussed. I stressed the importance of a bowel regimen to have daily soft bowel movements to minimize progression of disease. Interventions such as sclerotherapy & banding were discussed.  The patient's symptoms are not adequately controlled by medicines and other non-operative treatments. I feel the risks & problems of no surgery outweigh the operative  risks; therefore, I recommended surgery to treat the hemorrhoids by ligation, pexy, and possible resection.  Risks such as bleeding, infection, urinary difficulties, injury to other organs, need for repair of tissues / organs, need for further treatment, heart attack, death, and other risks were discussed. I  noted a good likelihood this will help address the problem. Goals of post-operative recovery were discussed as well. Possibility that this will not correct all symptoms was explained. Post-operative pain, bleeding, constipation, and other problems after surgery were discussed. We will work to minimize complications. Educational handouts further explaining the pathology, treatment options, and bowel regimen were given as well. Questions were answered. The patient expresses understanding & wishes to proceed with surgery.  Spent extra time with the help of interpreter to help repeatedly explain things. So gave a handout about hemorrhoids in Spanish and information in Spanish about hemorrhoids as well. His insight was fair initially but I think we helped explain the reasoning for the need for surgery. He is having worsening hemorrhoid issues and with significant internal and irritated external hemorrhoids it is not going to get better without outpatient surgery. I explained how we try to schedule these electively. Cannot do it immediately right now.  His dark stools are not normal. I recommend he establish with a primary care physician. He is overdue for colonoscopy screening since he is over 57 years of age. Noted if his bleeding and dark stools do not go away by 3 months from hemorrhoid surgery, strongly recommended gastrology consultation with probable upper and lower endoscopies to rule out foregut etiologies such as ulcers or other issues and rule out hindgut issues such as polyps cancers etc.   Adin Hector, MD, FACS, MASCRS Esophageal, Gastrointestinal & Colorectal Surgery Robotic and Minimally  Invasive Surgery  [facility] [address], [place], [place] [address] [phone] Fax [phone] Main  CONTACT INFORMATION:  Weekday (9AM-5PM): Call [facility] main office at [phone]  Weeknight (5PM-9AM) or Weekend/Holiday: Check [email or web address] (password " [email or web address]") for General Surgery [facility] coverage  (Please, do not use SecureChat as it is not reliable communication to reach operating surgeons for immediate patient care given surgeries/outpatient duties/clinic/cross-coverage/off post-call which would lead to a delay in care.  Epic staff messaging available for outptient concerns, but may not be answered for 48 hours or more).     11/28/2022

## 2022-11-28 NOTE — Anesthesia Preprocedure Evaluation (Addendum)
Anesthesia Evaluation  Patient identified by MRN, date of birth, ID band Patient awake    Reviewed: Allergy & Precautions, NPO status , Patient's Chart, lab work & pertinent test results  Airway Mallampati: I  TM Distance: >3 FB Neck ROM: Full    Dental no notable dental hx. (+) Dental Advisory Given, Teeth Intact   Pulmonary former smoker   Pulmonary exam normal breath sounds clear to auscultation       Cardiovascular hypertension, Normal cardiovascular exam Rhythm:Regular Rate:Normal     Neuro/Psych  Headaches  negative psych ROS   GI/Hepatic Neg liver ROS, hiatal hernia,GERD  Medicated and Controlled,,  Endo/Other  negative endocrine ROS    Renal/GU negative Renal ROS     Musculoskeletal Right elbow dislocation   Abdominal   Peds  Hematology  (+) Blood dyscrasia, anemia   Anesthesia Other Findings   Reproductive/Obstetrics                             Anesthesia Physical Anesthesia Plan  ASA: 2  Anesthesia Plan: General   Post-op Pain Management: Tylenol PO (pre-op)* and Gabapentin PO (pre-op)*   Induction:   PONV Risk Score and Plan: 3 and Midazolam, Dexamethasone, Ondansetron and Treatment may vary due to age or medical condition  Airway Management Planned: Oral ETT  Additional Equipment:   Intra-op Plan:   Post-operative Plan: Extubation in OR  Informed Consent: I have reviewed the patients History and Physical, chart, labs and discussed the procedure including the risks, benefits and alternatives for the proposed anesthesia with the patient or authorized representative who has indicated his/her understanding and acceptance.     Dental advisory given  Plan Discussed with: CRNA  Anesthesia Plan Comments:         Anesthesia Quick Evaluation

## 2022-11-28 NOTE — Anesthesia Procedure Notes (Signed)
Procedure Name: Intubation Date/Time: 11/28/2022 9:47 AM  Performed by: Niel Hummer, CRNAPre-anesthesia Checklist: Patient identified, Emergency Drugs available, Suction available and Patient being monitored Patient Re-evaluated:Patient Re-evaluated prior to induction Oxygen Delivery Method: Circle system utilized Preoxygenation: Pre-oxygenation with 100% oxygen Induction Type: IV induction Ventilation: Mask ventilation without difficulty Laryngoscope Size: 4 and Glidescope Grade View: Grade I Tube type: Oral Tube size: 7.5 mm Number of attempts: 1 Airway Equipment and Method: Stylet and Video-laryngoscopy Placement Confirmation: ETT inserted through vocal cords under direct vision, positive ETCO2 and breath sounds checked- equal and bilateral Secured at: 23 cm Tube secured with: Tape Dental Injury: Teeth and Oropharynx as per pre-operative assessment  Comments: DL with MAC 4 grade 3 view. Unable to pass tube. Glidescope 4 used, grade 1 view. Passed tube easily.

## 2022-11-29 ENCOUNTER — Encounter (HOSPITAL_COMMUNITY): Payer: Self-pay | Admitting: Surgery

## 2022-11-29 LAB — SURGICAL PATHOLOGY

## 2022-11-29 NOTE — Anesthesia Postprocedure Evaluation (Signed)
Anesthesia Post Note  Patient: Randy Barker  Procedure(s) Performed: HEMORRHOIDECTOMY WITH LIGATION AND HEMORRHOIDOPEXY EXAM UNDER ANESTHESIA     Patient location during evaluation: PACU Anesthesia Type: General Level of consciousness: sedated and patient cooperative Pain management: pain level controlled Vital Signs Assessment: post-procedure vital signs reviewed and stable Respiratory status: spontaneous breathing Cardiovascular status: stable Anesthetic complications: no   No notable events documented.  Last Vitals:  Vitals:   11/28/22 1130 11/28/22 1153  BP: (!) 149/101 (!) 146/94  Pulse: 79 78  Resp: 17   Temp:    SpO2: 98% 97%    Last Pain:  Vitals:   11/28/22 1130  TempSrc:   PainSc: 0-No pain                 Nolon Nations
